# Patient Record
Sex: Female | Born: 1985 | Race: Black or African American | Hispanic: No | Marital: Single | Smoking: Never smoker
Health system: Southern US, Community
[De-identification: ages and names within clinical notes are randomized; demographics above are authoritative.]

## PROBLEM LIST (undated history)

## (undated) DIAGNOSIS — N83209 Unspecified ovarian cyst, unspecified side: Secondary | ICD-10-CM

## (undated) DIAGNOSIS — R002 Palpitations: Secondary | ICD-10-CM

## (undated) HISTORY — DX: Palpitations: R00.2

## (undated) HISTORY — PX: BREAST EXCISIONAL BIOPSY: SUR124

## (undated) HISTORY — DX: Unspecified ovarian cyst, unspecified side: N83.209

---

## 2018-08-16 ENCOUNTER — Encounter (HOSPITAL_COMMUNITY): Payer: Self-pay | Admitting: Emergency Medicine

## 2018-08-16 ENCOUNTER — Emergency Department (HOSPITAL_COMMUNITY)
Admission: EM | Admit: 2018-08-16 | Discharge: 2018-08-16 | Disposition: A | Discharge status: Home / Self Care | Payer: Self-pay | Attending: Emergency Medicine | Admitting: Emergency Medicine

## 2018-08-16 DIAGNOSIS — R1032 Left lower quadrant pain: Secondary | ICD-10-CM | POA: Insufficient documentation

## 2018-08-16 LAB — URINALYSIS, ROUTINE W REFLEX MICROSCOPIC
Bilirubin Urine: NEGATIVE
Glucose, UA: NEGATIVE mg/dL
Hgb urine dipstick: NEGATIVE
Ketones, ur: NEGATIVE mg/dL
Leukocytes, UA: NEGATIVE
Nitrite: NEGATIVE
Protein, ur: NEGATIVE mg/dL
SPECIFIC GRAVITY, URINE: 1.03 (ref 1.005–1.030)
pH: 7 (ref 5.0–8.0)

## 2018-08-16 LAB — COMPREHENSIVE METABOLIC PANEL
ALBUMIN: 3.9 g/dL (ref 3.5–5.0)
ALK PHOS: 71 U/L (ref 38–126)
ALT: 12 U/L (ref 0–44)
AST: 18 U/L (ref 15–41)
Anion gap: 7 (ref 5–15)
BILIRUBIN TOTAL: 0.2 mg/dL — AB (ref 0.3–1.2)
BUN: 9 mg/dL (ref 6–20)
CALCIUM: 9.4 mg/dL (ref 8.9–10.3)
CO2: 24 mmol/L (ref 22–32)
Chloride: 106 mmol/L (ref 98–111)
Creatinine, Ser: 0.82 mg/dL (ref 0.44–1.00)
GFR calc Af Amer: >60 mL/min (ref >60)
GFR calc non Af Amer: >60 mL/min (ref >60)
GLUCOSE: 142 mg/dL — AB (ref 70–99)
Potassium: 3.8 mmol/L (ref 3.5–5.1)
SODIUM: 137 mmol/L (ref 135–145)
TOTAL PROTEIN: 7.3 g/dL (ref 6.5–8.1)

## 2018-08-16 LAB — WET PREP, GENITAL
CLUE CELLS WET PREP: NONE SEEN
Sperm: NONE SEEN
TRICH WET PREP: NONE SEEN
Yeast Wet Prep HPF POC: NONE SEEN

## 2018-08-16 LAB — CBC
HCT: 38 % (ref 36.0–46.0)
HEMOGLOBIN: 12.2 g/dL (ref 12.0–15.0)
MCH: 27.8 pg (ref 26.0–34.0)
MCHC: 32.1 g/dL (ref 30.0–36.0)
MCV: 86.6 fL (ref 80.0–100.0)
PLATELETS: 314 10*3/uL (ref 150–400)
RBC: 4.39 MIL/uL (ref 3.87–5.11)
RDW: 12.6 % (ref 11.5–15.5)
WBC: 8.9 10*3/uL (ref 4.0–10.5)
nRBC: 0 % (ref 0.0–0.2)

## 2018-08-16 LAB — GC/CHLAMYDIA PROBE AMP (~~LOC~~) NOT AT ARMC
CHLAMYDIA, DNA PROBE: NEGATIVE
NEISSERIA GONORRHEA: NEGATIVE

## 2018-08-16 LAB — LIPASE, BLOOD: Lipase: 30 U/L (ref 11–51)

## 2018-08-16 LAB — I-STAT BETA HCG BLOOD, ED (MC, WL, AP ONLY)

## 2018-08-16 LAB — RPR: RPR Ser Ql: NONREACTIVE

## 2018-08-16 LAB — HIV ANTIBODY (ROUTINE TESTING W REFLEX): HIV Screen 4th Generation wRfx: NONREACTIVE

## 2018-08-16 MED ORDER — ACETAMINOPHEN 325 MG PO TABS: 650.0000 mg | ORAL_TABLET | Freq: Four times a day (QID) | ORAL | 0 refills | Status: DC | PRN

## 2018-08-16 NOTE — ED Provider Notes (Signed)
Emergency Department Provider Note   I have reviewed the triage vital signs and the nursing notes.   HISTORY  Chief Complaint Abdominal Pain   HPI Robin Humphrey is a 32 y.o. female without significant past medical history the presents to the emergency department today secondary to pelvic pain.  Patient states that she was recently on the Depakote shot however has been off of it for couple months.  Last month she had a period that felt similar to this but then the symptoms resolved when her.  Went away.  Last week her.  Was back and normal with this pain but then the pain persisted and is progressively worsened since that time.  She states that it is worse in the left side seems to radiate up to her left flank.  Does not seem to be get better with ibuprofen and Tylenol at home.  She has no vaginal discharge or pain with intercourse.  She has no urinary symptoms.  No history of kidney stones.  She had no fever, nausea, vomiting, diarrhea or constipation.  She states when she laughs earlier seem to make the pain worse. No other associated or modifying symptoms.    History reviewed. No pertinent past medical history.  There are no active problems to display for this patient.   Past Surgical History:  Procedure Laterality Date  . CESAREAN SECTION        Allergies Patient has no known allergies.  No family history on file.  Social History Social History   Tobacco Use  . Smoking status: Never Smoker  . Smokeless tobacco: Never Used  Substance Use Topics  . Alcohol use: Not Currently  . Drug use: Not Currently    Review of Systems  All other systems negative except as documented in the HPI. All pertinent positives and negatives as reviewed in the HPI. ____________________________________________   PHYSICAL EXAM:  VITAL SIGNS: ED Triage Vitals  Enc Vitals Group     BP 08/16/18 0010 139/89     Pulse Rate 08/16/18 0010 (!) 102     Resp 08/16/18 0010 18     Temp  08/16/18 0010 98.1 F (36.7 C)     Temp Source 08/16/18 0010 Oral     SpO2 08/16/18 0010 97 %     Weight 08/16/18 0009 210 lb (95.3 kg)     Height 08/16/18 0009 5\' 7"  (1.702 m)    Constitutional: Alert and oriented. Well appearing and in no acute distress. Eyes: Conjunctivae are normal. PERRL. EOMI. Head: Atraumatic. Nose: No congestion/rhinnorhea. Mouth/Throat: Mucous membranes are moist.  Oropharynx non-erythematous. Neck: No stridor.  No meningeal signs.   Cardiovascular: Normal rate, regular rhythm. Good peripheral circulation. Grossly normal heart sounds.   Respiratory: Normal respiratory effort.  No retractions. Lungs CTAB. Gastrointestinal: Soft and mild left pelvic ttp. No distention.  Musculoskeletal: No lower extremity tenderness nor edema. No gross deformities of extremities. Neurologic:  Normal speech and language. No gross focal neurologic deficits are appreciated.  Skin:  Skin is warm, dry and intact. No rash noted.   ____________________________________________   LABS (all labs ordered are listed, but only abnormal results are displayed)  Labs Reviewed  WET PREP, GENITAL - Abnormal; Notable for the following components:      Result Value   WBC, Wet Prep HPF POC FEW (*)    All other components within normal limits  COMPREHENSIVE METABOLIC PANEL - Abnormal; Notable for the following components:   Glucose, Bld 142 (*)  Total Bilirubin 0.2 (*)    All other components within normal limits  URINALYSIS, ROUTINE W REFLEX MICROSCOPIC - Abnormal; Notable for the following components:   APPearance HAZY (*)    All other components within normal limits  LIPASE, BLOOD  CBC  RPR  HIV ANTIBODY (ROUTINE TESTING W REFLEX)  I-STAT BETA HCG BLOOD, ED ([facility], [facility], [facility] ONLY)  GC/CHLAMYDIA PROBE AMP ([place]) NOT AT [facility]   ____________________________________________   INITIAL IMPRESSION / ASSESSMENT AND PLAN / ED COURSE  Likely ovarian cyst. Doesn't want to verify with  [place]. Low suspicion for ovarian torsion or TOA. Will follow up w/ gynecology.    Pertinent labs & imaging results that were available during my care of the patient were reviewed by me and considered in my medical decision making (see chart for details).  ____________________________________________  FINAL CLINICAL IMPRESSION(S) / ED DIAGNOSES  Final diagnoses:  Left lower quadrant abdominal pain    MEDICATIONS GIVEN DURING THIS VISIT:  Medications - No data to display   NEW OUTPATIENT MEDICATIONS STARTED DURING THIS VISIT:  Discharge Medication List as of 08/16/2018  2:49 AM    START taking these medications   Details  acetaminophen (TYLENOL) 325 MG tablet Take 2 tablets (650 mg total) by mouth every 6 (six) hours as needed., Starting Fri 08/16/2018, Print        Note:  This note was prepared with assistance of Dragon voice recognition software. Occasional wrong-word or sound-a-like substitutions may have occurred due to the inherent limitations of voice recognition software.   Marily Memos, MD 08/16/18 [phone]

## 2018-08-16 NOTE — ED Triage Notes (Signed)
Pt reports LLQ abd pain with radiation into L side onset X few days. Pt states she had her first period in months last week after going off Depo but states this pain is worse. Denis current vaginal bleeding or vaginal pain.

## 2018-08-16 NOTE — ED Notes (Signed)
Pelvic cart at bedside. 

## 2018-09-12 ENCOUNTER — Encounter: Payer: Self-pay | Admitting: Family Medicine

## 2018-10-14 ENCOUNTER — Other Ambulatory Visit (HOSPITAL_COMMUNITY)
Admission: RE | Admit: 2018-10-14 | Discharge: 2018-10-14 | Disposition: A | Discharge status: Home / Self Care | Payer: Medicaid Other | Source: Ambulatory Visit | Attending: Obstetrics and Gynecology | Admitting: Obstetrics and Gynecology

## 2018-10-14 ENCOUNTER — Encounter: Payer: Self-pay | Admitting: Obstetrics and Gynecology

## 2018-10-14 ENCOUNTER — Ambulatory Visit (INDEPENDENT_AMBULATORY_CARE_PROVIDER_SITE_OTHER): Payer: Self-pay | Admitting: Obstetrics and Gynecology

## 2018-10-14 VITALS — BP 120/72 | HR 88 | Ht 67.0 in | Wt 214.2 lb

## 2018-10-14 DIAGNOSIS — R109 Unspecified abdominal pain: Secondary | ICD-10-CM | POA: Diagnosis not present

## 2018-10-14 DIAGNOSIS — G8929 Other chronic pain: Secondary | ICD-10-CM

## 2018-10-14 NOTE — Progress Notes (Signed)
C/o intermittent pelvic pain since November-states it worse just before and after period. States last pap 11/2016 at [facility]

## 2018-10-14 NOTE — Progress Notes (Signed)
Obstetrics and Gynecology New Patient Evaluation  Appointment Date: 10/14/2018  OBGYN Clinic: Center for [facility]  Primary Care Provider: Patient, No Pcp Per  Referring Provider: No ref. provider found  Chief Complaint:  Chief Complaint  Patient presents with  . Pelvic Pain    History of Present Illness: Robin Humphrey is a 33 y.o. African-American [phone] (Patient's last menstrual period was 09/26/2018.), seen for the above chief complaint.   Patient was on depo provera for about a year but came off it in September due to gaining weight; she was amenorrheic on it. Her periods came back and she had LLQ abdominal pain around her menses but in November she had pain just like around her periods but the pain persisted and has persisted since then; it comes and goes and no aggravating or alleviating s/s. She has used OCPs in the past  She went to the ED in mid November and had negative lab work; no u/s was done.   Review of Systems: as noted in the History of Present Illness.  Past Medical History:  No past medical history on file.  Past Surgical History:  Past Surgical History:  Procedure Laterality Date  . CESAREAN SECTION      Past Obstetrical History:  OB History  Gravida Para Term Preterm AB Living  4       2 2   SAB TAB Ectopic Multiple Live Births  1 1     2     # Outcome Date GA Lbr Len/2nd Weight Sex Delivery Anes PTL Lv  4 Gravida 2010     CS-Unspec     3 Gravida 2009     CS-Unspec     2 TAB           1 SAB             Past Gynecological History: As per HPI. History of Pap Smear(s): Yes.   Last pap 2018, which was negative per patient at the [facility] She is currently using nothing for contraception.   Social History:  Social History   Socioeconomic History  . Marital status: Single    Spouse name: Not on file  . Number of children: Not on file  . Years of education: Not on file  . Highest education level: Not on file  Occupational History  . Not on  file  Social Needs  . Financial resource strain: Not on file  . Food insecurity:    Worry: Not on file    Inability: Not on file  . Transportation needs:    Medical: Not on file    Non-medical: Not on file  Tobacco Use  . Smoking status: Never Smoker  . Smokeless tobacco: Never Used  Substance and Sexual Activity  . Alcohol use: Not Currently  . Drug use: Not Currently  . Sexual activity: Yes    Birth control/protection: None  Lifestyle  . Physical activity:    Days per week: Not on file    Minutes per session: Not on file  . Stress: Not on file  Relationships  . Social connections:    Talks on phone: Not on file    Gets together: Not on file    Attends religious service: Not on file    Active member of club or organization: Not on file    Attends meetings of clubs or organizations: Not on file    Relationship status: Not on file  . Intimate partner violence:    Fear of current or  ex partner: Not on file    Emotionally abused: Not on file    Physically abused: Not on file    Forced sexual activity: Not on file  Other Topics Concern  . Not on file  Social History Narrative  . Not on file    Family History: No family history on file.   Medications [name] had no medications administered during this visit. Current Outpatient Medications  Medication Sig Dispense Refill  . acetaminophen (TYLENOL) 325 MG tablet Take 2 tablets (650 mg total) by mouth every 6 (six) hours as needed. 30 tablet 0   No current facility-administered medications for this visit.     Allergies Patient has no known allergies.   Physical Exam:  BP 120/72   Pulse 88   Ht 5\' 7"  (1.702 m)   Wt 214 lb 3.2 oz (97.2 kg)   LMP 09/26/2018   BMI 33.55 kg/m  Body mass index is 33.55 kg/m. General appearance: Well nourished, well developed female in no acute distress.  Cardiovascular: normal s1 and s2.  No murmurs, rubs or gallops. Respiratory: Normal respiratory effort palpation, non  distended Abdomen: soft, nttp. Well healed low transverse skin incision Neuro/Psych:  Normal mood and affect.  Skin:  Warm and dry.  Lymphatic:  No inguinal lymphadenopathy.   Pelvic exam: is not limited by body habitus EGBUS: within normal limits, Vagina: within normal limits and with no blood or discharge in the vault, Cervix: normal appearing cervix without tenderness, slightly erythematous (non friable) with 2-3 nabothian cysts. Uterus:  nonenlarged and non tender and Adnexa:  normal adnexa and no mass, fullness, tenderness Rectovaginal: deferred  Laboratory: reviewed  Radiology: none  Assessment: pt stable  Plan:  1. Chronic abdominal pain Patient not actively trying to get pregnant. D/w her that if u/s is negative likely will recommend some type of hormonal therapy.  - Urine Culture - US PELVIC COMPLETE WITH TRANSVAGINAL; Future  Orders Placed This Encounter  Procedures  . Urine Culture  . US PELVIC COMPLETE WITH TRANSVAGINAL    RTC after u/s  Cornelia Copa MD Attending Center for [facility])

## 2018-10-15 LAB — URINE CULTURE

## 2018-10-18 LAB — CYTOLOGY - PAP
Diagnosis: NEGATIVE
HPV (WINDOPATH): NOT DETECTED

## 2018-11-05 ENCOUNTER — Ambulatory Visit (HOSPITAL_COMMUNITY)
Admission: RE | Admit: 2018-11-05 | Discharge: 2018-11-05 | Disposition: A | Discharge status: Home / Self Care | Payer: Medicaid Other | Source: Ambulatory Visit | Attending: Obstetrics and Gynecology | Admitting: Obstetrics and Gynecology

## 2018-11-05 DIAGNOSIS — R109 Unspecified abdominal pain: Secondary | ICD-10-CM | POA: Insufficient documentation

## 2018-11-05 DIAGNOSIS — G8929 Other chronic pain: Secondary | ICD-10-CM

## 2018-11-13 ENCOUNTER — Encounter: Payer: Self-pay | Admitting: Obstetrics and Gynecology

## 2018-11-13 ENCOUNTER — Ambulatory Visit (INDEPENDENT_AMBULATORY_CARE_PROVIDER_SITE_OTHER): Payer: Self-pay | Admitting: Obstetrics and Gynecology

## 2018-11-13 VITALS — BP 112/67 | HR 85 | Wt 214.0 lb

## 2018-11-13 DIAGNOSIS — N83202 Unspecified ovarian cyst, left side: Secondary | ICD-10-CM

## 2018-11-13 DIAGNOSIS — G8929 Other chronic pain: Secondary | ICD-10-CM

## 2018-11-13 DIAGNOSIS — R109 Unspecified abdominal pain: Secondary | ICD-10-CM

## 2018-11-13 MED ORDER — NORGESTIMATE-ETH ESTRADIOL 0.25-35 MG-MCG PO TABS: 1.0000 | ORAL_TABLET | Freq: Every day | ORAL | 3 refills | Status: DC

## 2018-11-13 NOTE — Progress Notes (Signed)
Obstetrics and Gynecology Visit Return Patient Evaluation  Appointment Date: 11/13/2018  Primary Care Provider: Default, Provider  OBGYN Clinic: Center for [facility]  Chief Complaint: follow up ultrasound   History of Present Illness:  Robin Humphrey is a 33 y.o. here with above CC. Patient seen on 1/13 for f/u ED visit for abdominal pain around the time of her period.    Interval History: Since that time, she states that her pain is much improved. LMP 1/20  Review of Systems:  as noted in the History of Present Illness.   Patient Active Problem List   Diagnosis Date Noted  . Left ovarian cyst 11/13/2018  . Chronic abdominal pain 10/14/2018   Medications:  Robin Humphrey had no medications administered during this visit. Current Outpatient Medications  Medication Sig Dispense Refill  . acetaminophen (TYLENOL) 325 MG tablet Take 2 tablets (650 mg total) by mouth every 6 (six) hours as needed. 30 tablet 0   No current facility-administered medications for this visit.     Allergies: has No Known Allergies.  Physical Exam:  BP 112/67   Pulse 85   Wt 214 lb (97.1 kg)   BMI 33.52 kg/m  Body mass index is 33.52 kg/m. General appearance: Well nourished, well developed female in no acute distress.  Abdomen: diffusely non tender to palpation, non distended Neuro/Psych:  Normal mood and affect.    Radiology  CLINICAL DATA:  Chronic pelvic pain  EXAM: TRANSABDOMINAL AND TRANSVAGINAL ULTRASOUND OF PELVIS  TECHNIQUE: Both transabdominal and transvaginal ultrasound examinations of the pelvis were performed. Transabdominal technique was performed for global imaging of the pelvis including uterus, ovaries, adnexal regions, and pelvic cul-de-sac. It was necessary to proceed with endovaginal exam following the transabdominal exam to visualize the endometrium and adnexa.  COMPARISON:  None  FINDINGS: Uterus  Measurements: 9.8 x 5.5 x 6.9 cm = volume: 196 mL.  Normal morphology without mass  Endometrium  Thickness: 15 mm. Heterogeneous appearance hypoechoic area 12 x 14 x 4 mm which could represent a small amount of blood. Remainder of the endometrium appears heterogeneous as well.  Right ovary  Measurements: 3.7 x 3.2 x 3.9 cm = volume: 23.7 mL. Normal morphology without mass. Blood flow present within RIGHT ovary on color Doppler imaging.  Left ovary  Measurements: 3.9 x 2.8 x 3.5 cm = volume: 20.5 mL. Small heterogenous nodule question hemorrhagic cyst measuring 1.9 x 1.4 x 1.5 cm. Blood flow present within LEFT ovary on color Doppler imaging.  Other findings  No free pelvic fluid.  No additional adnexal masses.  IMPRESSION: Heterogeneous appearance of the endometrial complex with a small hypoechoic collection 12 x 14 x 4 mm question endometrial blood.  Small probable hemorrhagic cyst of the LEFT ovary 1.9 cm diameter; short-interval follow up ultrasound in 6-12 weeks is recommended, preferably during the week following the patient's normal menses.   Electronically Signed   By: Ulyses Southward M.D.   On: 11/05/2018 09:51 Assessment: pt doing well  Plan:  1. Left ovarian cyst Recommend 21m follow up visit. Pt to call to look at her availability and call us to set up early cycle u/s  2. Chronic abdominal pain I told her most likely due to a hemorrhagic cyst which may be a rare occurrence in her but hard to say since she is still pretty recently off her depo provera. D/w her re: r/b/a and she would like to do OCPs. Pt doesn't have insurance and was getting depo at the HD.  Paper Rx given for OCPs and recommend after next period and told pt to call HD to see about pricing there, as well.   RTC: PRN  Cornelia Copa MD Attending Center for [facility])

## 2019-04-30 ENCOUNTER — Other Ambulatory Visit: Payer: Self-pay

## 2019-04-30 ENCOUNTER — Emergency Department (HOSPITAL_COMMUNITY)
Admission: EM | Admit: 2019-04-30 | Discharge: 2019-05-01 | Discharge status: Against Medical Advice | Payer: Self-pay | Attending: Emergency Medicine | Admitting: Emergency Medicine

## 2019-04-30 ENCOUNTER — Emergency Department (HOSPITAL_COMMUNITY): Payer: Self-pay

## 2019-04-30 DIAGNOSIS — Z5321 Procedure and treatment not carried out due to patient leaving prior to being seen by health care provider: Secondary | ICD-10-CM | POA: Insufficient documentation

## 2019-04-30 LAB — I-STAT BETA HCG BLOOD, ED (MC, WL, AP ONLY): I-stat hCG, quantitative: <5 m[IU]/mL (ref <5)

## 2019-04-30 LAB — CBC
HCT: 38.1 % (ref 36.0–46.0)
Hemoglobin: 12.4 g/dL (ref 12.0–15.0)
MCH: 27.3 pg (ref 26.0–34.0)
MCHC: 32.5 g/dL (ref 30.0–36.0)
MCV: 83.9 fL (ref 80.0–100.0)
Platelets: 332 10*3/uL (ref 150–400)
RBC: 4.54 MIL/uL (ref 3.87–5.11)
RDW: 13.6 % (ref 11.5–15.5)
WBC: 8.7 10*3/uL (ref 4.0–10.5)
nRBC: 0 % (ref 0.0–0.2)

## 2019-04-30 NOTE — ED Triage Notes (Signed)
Pt c/o shortness of breath and chest pain; told her boyfriend that she thought she was having a panic attack.

## 2019-05-01 LAB — BASIC METABOLIC PANEL
Anion gap: 14 (ref 5–15)
BUN: 8 mg/dL (ref 6–20)
CO2: 19 mmol/L — ABNORMAL LOW (ref 22–32)
Calcium: 9.7 mg/dL (ref 8.9–10.3)
Chloride: 104 mmol/L (ref 98–111)
Creatinine, Ser: 0.75 mg/dL (ref 0.44–1.00)
GFR calc Af Amer: >60 mL/min (ref >60)
GFR calc non Af Amer: >60 mL/min (ref >60)
Glucose, Bld: 159 mg/dL — ABNORMAL HIGH (ref 70–99)
Potassium: 3.2 mmol/L — ABNORMAL LOW (ref 3.5–5.1)
Sodium: 137 mmol/L (ref 135–145)

## 2019-05-01 LAB — TROPONIN I (HIGH SENSITIVITY): Troponin I (High Sensitivity): 4 ng/L (ref <18)

## 2019-05-01 NOTE — ED Notes (Signed)
Called pt. x2 for additional labs with no response

## 2019-05-01 NOTE — ED Notes (Signed)
Pt did not respond to name call for vitals recheck

## 2019-05-01 NOTE — ED Notes (Signed)
Pt still does not respond for vitals recheck

## 2019-08-10 ENCOUNTER — Other Ambulatory Visit: Payer: Self-pay

## 2019-08-10 ENCOUNTER — Ambulatory Visit (INDEPENDENT_AMBULATORY_CARE_PROVIDER_SITE_OTHER): Payer: Self-pay

## 2019-08-10 ENCOUNTER — Ambulatory Visit (HOSPITAL_COMMUNITY)
Admission: EM | Admit: 2019-08-10 | Discharge: 2019-08-10 | Disposition: A | Discharge status: Home / Self Care | Payer: Self-pay | Attending: Family Medicine | Admitting: Family Medicine

## 2019-08-10 DIAGNOSIS — R079 Chest pain, unspecified: Secondary | ICD-10-CM

## 2019-08-10 DIAGNOSIS — F410 Panic disorder [episodic paroxysmal anxiety] without agoraphobia: Secondary | ICD-10-CM

## 2019-08-10 DIAGNOSIS — F411 Generalized anxiety disorder: Secondary | ICD-10-CM

## 2019-08-10 MED ORDER — ALPRAZOLAM 0.5 MG PO TABS: ORAL_TABLET | ORAL | 0 refills | Status: DC

## 2019-08-10 NOTE — ED Triage Notes (Signed)
Pt states having chest pain and pressure in her chest x 2 days. Pt states when she feels the pressure in her chest she feels the pressure in her left arm.

## 2019-08-10 NOTE — Discharge Instructions (Signed)
Need to see a primary care physician I recommend a counselor Take the medicine as needed for panic or severe anxiety This will not be refilled

## 2019-08-10 NOTE — ED Provider Notes (Signed)
[facility]    CSN: [id number] Arrival date & time: 08/10/19  1020      History   Chief Complaint Chief Complaint  Patient presents with  . Chest Pain  . Arm Pain    HPI Robin Humphrey is a 33 y.o. female.   HPI  Robin Humphrey is a single mother.  Lives with her 67-year-old son.  He has autism.  She does get some help from his father and from her mother.  She works nights.  She does not get enough sleep.  She states she had a panic attack about 2 months ago.  She states that she felt like she was going to die.  Felt like she was going to stop breathing.  It finally resolved after about 30 minutes.  She does not feel like she is stressed.  She states she is never been treated for anxiety or depression.  She is tearful frequently throughout the interview. She is here for chest pain.  She gets up pressure in the center of her chest.  She also states she has felt short of breath ever since her last panic attack.  Sometimes when her chest bothers her she will feel some pain in her left arm.  No exertional pain.  No hypertension, diabetes, sedentary lifestyle, cigarette smoking, or cardiac history in the family.  No prior history of cardiac problems.  No lung disease  No past medical history on file.  Patient Active Problem List   Diagnosis Date Noted  . Left ovarian cyst 11/13/2018  . Chronic abdominal pain 10/14/2018    Past Surgical History:  Procedure Laterality Date  . CESAREAN SECTION      OB History    Gravida  4   Para      Term      Preterm      AB  2   Living  2     SAB  1   TAB  1   Ectopic      Multiple      Live Births  2            Home Medications    Prior to Admission medications   Medication Sig Start Date End Date Taking? Authorizing Provider  acetaminophen (TYLENOL) 325 MG tablet Take 2 tablets (650 mg total) by mouth every 6 (six) hours as needed. 08/16/18   Mesner, Corene Cornea, MD  ALPRAZolam Duanne Moron) 0.5 MG tablet Take at first sign of  panic.  Today, take one when you get the prescription and go home to sleep 08/10/19   Raylene Everts, MD  norgestimate-ethinyl estradiol (ORTHO-CYCLEN,SPRINTEC,PREVIFEM) 0.25-35 MG-MCG tablet Take 1 tablet by mouth daily. 11/13/18   Aletha Halim, MD    Family History No family history on file.  Social History Social History   Tobacco Use  . Smoking status: Never Smoker  . Smokeless tobacco: Never Used  Substance Use Topics  . Alcohol use: Not Currently  . Drug use: Not Currently     Allergies   Patient has no known allergies.   Review of Systems Review of Systems  Constitutional: Negative for chills and fever.  HENT: Negative for ear pain and sore throat.   Eyes: Negative for pain and visual disturbance.  Respiratory: Positive for shortness of breath. Negative for cough.   Cardiovascular: Positive for chest pain. Negative for palpitations.  Gastrointestinal: Negative for abdominal pain and vomiting.  Genitourinary: Negative for dysuria and hematuria.  Musculoskeletal: Negative for arthralgias and back pain.  Skin: Negative for color change and rash.  Neurological: Negative for seizures and syncope.  All other systems reviewed and are negative.    Physical Exam Triage Vital Signs ED Triage Vitals  Enc Vitals Group     BP 08/10/19 1046 124/78     Pulse Rate 08/10/19 1046 85     Resp 08/10/19 1046 17     Temp 08/10/19 1046 98.3 F (36.8 C)     Temp Source 08/10/19 1046 Oral     SpO2 08/10/19 1046 100 %     Weight --      Height --      Head Circumference --      Peak Flow --      Pain Score 08/10/19 1045 5     Pain Loc --      Pain Edu? --      Excl. in GC? --    No data found.  Updated Vital Signs BP 124/78 (BP Location: Left Arm)   Pulse 85   Temp 98.3 F (36.8 C) (Oral)   Resp 17   LMP  (Within Weeks) Comment: 2-3 weeks per pt  SpO2 100%   Visual Acuity Right Eye Distance:   Left Eye Distance:   Bilateral Distance:    Right Eye Near:    Left Eye Near:    Bilateral Near:     Physical Exam Constitutional:      General: She is not in acute distress.    Appearance: She is well-developed.  HENT:     Head: Normocephalic and atraumatic.  Eyes:     Conjunctiva/sclera: Conjunctivae normal.     Pupils: Pupils are equal, round, and reactive to light.  Neck:     Musculoskeletal: Normal range of motion.  Cardiovascular:     Rate and Rhythm: Normal rate and regular rhythm.  No extrasystoles are present.    Heart sounds: Normal heart sounds. No murmur.  Pulmonary:     Effort: Pulmonary effort is normal. No respiratory distress.     Breath sounds: Normal breath sounds.  Chest:     Chest wall: No tenderness.  Abdominal:     General: Bowel sounds are normal. There is no distension.     Palpations: Abdomen is soft.     Tenderness: There is no abdominal tenderness.  Musculoskeletal: Normal range of motion.  Skin:    General: Skin is warm and dry.  Neurological:     Mental Status: She is alert.  Psychiatric:        Mood and Affect: Mood is anxious.      UC Treatments / Results  Labs (all labs ordered are listed, but only abnormal results are displayed) Labs Reviewed - No data to display  EKG EKG normal sinus rhythm.  Normal intervals.  No ST or T wave changes  Radiology Dg Chest 2 View  Result Date: 08/10/2019 CLINICAL DATA:  Chest pain. EXAM: CHEST - 2 VIEW COMPARISON:  Chest radiograph 04/30/2019 FINDINGS: The heart size and mediastinal contours are within normal limits. Both lungs are clear. The visualized skeletal structures are unremarkable. IMPRESSION: No active cardiopulmonary disease. Electronically Signed   By: Annia Belt M.D.   On: 08/10/2019 11:51    Procedures Procedures (including critical care time)  Medications Ordered in UC Medications - No data to display  Initial Impression / Assessment and Plan / UC Course  I have reviewed the triage vital signs and the nursing notes.  Pertinent labs &  imaging results that  were available during my care of the patient were reviewed by me and considered in my medical decision making (see chart for details).     We discussed chest pain.  The various causes.  I believe she is having stress related chest pressure.  I believe she needs more help in the home.  She would likely benefit from counseling. We discussed the medicine I am prescribing for her.  I told her this is not a good daily medicine.  It is a good as needed medicine.  It will help with panic attacks and severe anxiety.  It is not a medicine we will keep her on or refill for her. Final Clinical Impressions(s) / UC Diagnoses   Final diagnoses:  Chest pain, unspecified type  Generalized anxiety disorder with panic attacks     Discharge Instructions     Need to see a primary care physician I recommend a counselor Take the medicine as needed for panic or severe anxiety This will not be refilled   ED Prescriptions    Medication Sig Dispense Auth. Provider   ALPRAZolam Prudy Feeler(XANAX) 0.5 MG tablet Take at first sign of panic.  Today, take one when you get the prescription and go home to sleep 12 tablet Eustace MooreNelson, Trigger Frasier Sue, MD     I have reviewed the PDMP during this encounter.   Eustace MooreNelson, [place] Cantero Sue, MD 08/10/19 1919

## 2019-08-18 ENCOUNTER — Emergency Department (HOSPITAL_COMMUNITY): Payer: Medicaid Other

## 2019-08-18 ENCOUNTER — Emergency Department (HOSPITAL_COMMUNITY)
Admission: EM | Admit: 2019-08-18 | Discharge: 2019-08-18 | Disposition: A | Discharge status: Home / Self Care | Payer: Medicaid Other | Attending: Emergency Medicine | Admitting: Emergency Medicine

## 2019-08-18 ENCOUNTER — Other Ambulatory Visit: Payer: Self-pay

## 2019-08-18 ENCOUNTER — Encounter (HOSPITAL_COMMUNITY): Payer: Self-pay

## 2019-08-18 DIAGNOSIS — R0602 Shortness of breath: Secondary | ICD-10-CM | POA: Insufficient documentation

## 2019-08-18 DIAGNOSIS — R079 Chest pain, unspecified: Secondary | ICD-10-CM

## 2019-08-18 LAB — BASIC METABOLIC PANEL
Anion gap: 10 (ref 5–15)
BUN: 8 mg/dL (ref 6–20)
CO2: 21 mmol/L — ABNORMAL LOW (ref 22–32)
Calcium: 9.4 mg/dL (ref 8.9–10.3)
Chloride: 105 mmol/L (ref 98–111)
Creatinine, Ser: 0.75 mg/dL (ref 0.44–1.00)
GFR calc Af Amer: >60 mL/min (ref >60)
GFR calc non Af Amer: >60 mL/min (ref >60)
Glucose, Bld: 101 mg/dL — ABNORMAL HIGH (ref 70–99)
Potassium: 4 mmol/L (ref 3.5–5.1)
Sodium: 136 mmol/L (ref 135–145)

## 2019-08-18 LAB — CBC
HCT: 40.3 % (ref 36.0–46.0)
Hemoglobin: 13.2 g/dL (ref 12.0–15.0)
MCH: 27.8 pg (ref 26.0–34.0)
MCHC: 32.8 g/dL (ref 30.0–36.0)
MCV: 84.8 fL (ref 80.0–100.0)
Platelets: 323 10*3/uL (ref 150–400)
RBC: 4.75 MIL/uL (ref 3.87–5.11)
RDW: 13.1 % (ref 11.5–15.5)
WBC: 7 10*3/uL (ref 4.0–10.5)
nRBC: 0 % (ref 0.0–0.2)

## 2019-08-18 LAB — TROPONIN I (HIGH SENSITIVITY)
Troponin I (High Sensitivity): <2 ng/L (ref <18)
Troponin I (High Sensitivity): 6 ng/L (ref <18)

## 2019-08-18 LAB — D-DIMER, QUANTITATIVE: D-Dimer, Quant: <0.27 ug/mL-FEU (ref 0.00–0.50)

## 2019-08-18 LAB — I-STAT BETA HCG BLOOD, ED (MC, WL, AP ONLY): I-stat hCG, quantitative: <5 m[IU]/mL (ref <5)

## 2019-08-18 MED ORDER — NAPROXEN 500 MG PO TABS: 500.0000 mg | ORAL_TABLET | Freq: Two times a day (BID) | ORAL | 0 refills | Status: DC

## 2019-08-18 NOTE — ED Provider Notes (Signed)
[facility] EMERGENCY DEPARTMENT Provider Note   CSN: [id number] Arrival date & time: 08/18/19  1453     History   Chief Complaint Chief Complaint  Patient presents with  . Chest Pain    HPI Robin Humphrey is a 33 y.o. female with a hx of prior c-section who presents to the ED with complaints of chest discomfort intermittently for the past 1 week.  Patient describes the pain as a substernal pressure which occurs intermittently, daily, variable durations in terms of how long this lasts.  This seems to be a bit worse when she lays in the supine position, no change with exertion or with deep breathing.  She states that with the discomfort she feels her breathing is heavier than usual, at times she has associated anxiety but not consistently.  She was seen in urgent care last week for her symptoms, they were felt to be anxiety related, she was given a prescription for benzos, she has taken them a few times with relief when she did have the associated anxiety.  No other alleviating or aggravating factors.  She denies fever, chills, nausea, vomiting, diaphoresis, syncope, leg pain/swelling, hemoptysis, recent surgery/trauma, recent long travel,  personal hx of cancer, or hx of DVT/PE.  She does take OCPs.  She is unsure about early family history of heart disease.  Denies SI, HI, or hallucinations.        HPI  History reviewed. No pertinent past medical history.  Patient Active Problem List   Diagnosis Date Noted  . Left ovarian cyst 11/13/2018  . Chronic abdominal pain 10/14/2018    Past Surgical History:  Procedure Laterality Date  . CESAREAN SECTION       OB History    Gravida  4   Para      Term      Preterm      AB  2   Living  2     SAB  1   TAB  1   Ectopic      Multiple      Live Births  2            Home Medications    Prior to Admission medications   Medication Sig Start Date End Date Taking? Authorizing Provider   acetaminophen (TYLENOL) 325 MG tablet Take 2 tablets (650 mg total) by mouth every 6 (six) hours as needed. 08/16/18   Mesner, Corene Cornea, MD  ALPRAZolam Duanne Moron) 0.5 MG tablet Take at first sign of panic.  Today, take one when you get the prescription and go home to sleep 08/10/19   Raylene Everts, MD  norgestimate-ethinyl estradiol (ORTHO-CYCLEN,SPRINTEC,PREVIFEM) 0.25-35 MG-MCG tablet Take 1 tablet by mouth daily. 11/13/18   Aletha Halim, MD    Family History History reviewed. No pertinent family history.  Social History Social History   Tobacco Use  . Smoking status: Never Smoker  . Smokeless tobacco: Never Used  Substance Use Topics  . Alcohol use: Yes  . Drug use: Not Currently     Allergies   Patient has no known allergies.   Review of Systems Review of Systems  Constitutional: Negative for chills, diaphoresis and fever.  Respiratory: Positive for shortness of breath. Negative for cough.   Cardiovascular: Positive for chest pain (pressure). Negative for palpitations and leg swelling.  Gastrointestinal: Negative for abdominal pain, nausea and vomiting.  Musculoskeletal: Negative for myalgias.  Neurological: Negative for dizziness, syncope, weakness and numbness.  All other systems reviewed and are  negative.    Physical Exam Updated Vital Signs BP (!) 122/97 (BP Location: Right Arm)   Pulse 88   Temp 98 F (36.7 C) (Oral)   Resp 16   LMP 07/28/2019 (Approximate)   SpO2 99%   Physical Exam Vitals signs and nursing note reviewed.  Constitutional:      General: She is not in acute distress.    Appearance: She is well-developed. She is not toxic-appearing.  HENT:     Head: Normocephalic and atraumatic.  Eyes:     General:        Right eye: No discharge.        Left eye: No discharge.     Conjunctiva/sclera: Conjunctivae normal.  Neck:     Musculoskeletal: Neck supple.  Cardiovascular:     Rate and Rhythm: Normal rate and regular rhythm.     Pulses:           Radial pulses are 2+ on the right side and 2+ on the left side.       Dorsalis pedis pulses are 2+ on the right side and 2+ on the left side.  Pulmonary:     Effort: Pulmonary effort is normal. No respiratory distress.     Breath sounds: Normal breath sounds. No wheezing, rhonchi or rales.  Chest:     Chest wall: No tenderness.  Abdominal:     General: There is no distension.     Palpations: Abdomen is soft.     Tenderness: There is no abdominal tenderness.  Musculoskeletal:     Right lower leg: She exhibits no tenderness. No edema.     Left lower leg: She exhibits no tenderness. No edema.  Skin:    General: Skin is warm and dry.     Findings: No rash.  Neurological:     Mental Status: She is alert.     Comments: Clear speech.   Psychiatric:        Behavior: Behavior normal.    ED Treatments / Results  Labs (all labs ordered are listed, but only abnormal results are displayed) Labs Reviewed  BASIC METABOLIC PANEL - Abnormal; Notable for the following components:      Result Value   CO2 21 (*)    Glucose, Bld 101 (*)    All other components within normal limits  CBC  I-STAT BETA HCG BLOOD, ED ([facility], [facility], AP ONLY)  TROPONIN I (HIGH SENSITIVITY)  TROPONIN I (HIGH SENSITIVITY)    EKG EKG Interpretation  Date/Time:  Monday August 18 2019 15:18:36 EST Ventricular Rate:  94 PR Interval:  152 QRS Duration: 82 QT Interval:  332 QTC Calculation: 415 R Axis:   89 Text Interpretation: Normal sinus rhythm with sinus arrhythmia Normal ECG Confirmed by Geoffery LyonseLo, Douglas ([id number]) on 08/18/2019 7:11:05 PM   Radiology Dg Chest 2 View  Result Date: 08/18/2019 CLINICAL DATA:  Chest pain. EXAM: CHEST - 2 VIEW COMPARISON:  August 10, 2019. FINDINGS: The heart size and mediastinal contours are within normal limits. Both lungs are clear. No pneumothorax or pleural effusion is noted. The visualized skeletal structures are unremarkable. IMPRESSION: No active cardiopulmonary disease.  Electronically Signed   By: Lupita RaiderJames  Green Jr M.D.   On: 08/18/2019 16:18    Procedures Procedures (including critical care time)  Medications Ordered in ED Medications - No data to display   Initial Impression / Assessment and Plan / ED Course  I have reviewed the triage vital signs and the nursing notes.  Pertinent labs &  imaging results that were available during my care of the patient were reviewed by me and considered in my medical decision making (see chart for details).    Patient presents to the emergency department with chest pain. Patient nontoxic appearing, in no apparent distress, vitals without significant abnormality. Fairly benign physical exam. DDX: ACS, pulmonary embolism, dissection, pneumothorax, CHF, infiltrate, arrhythmia, anemia, pericarditis, electrolyte derangement, MSK, GERD, anxiety. Evaluation initiated with labs, EKG, and CXR. Patient on cardiac monitor.   Work-up in the ER reviewed:  CBC: No anemia or leukocytosis. BMP: No significant electrolyte derangement. D-dimer: WNL.  Troponin: No significant elevation. EKG: No STEMI. CXR: Negative, without infiltrate, effusion, pneumothorax, or fracture/dislocation.   Heart pathway score 1, EKG without obvious ischemia, delta troponin without significant elevation, mild change between 1st/2nd troponin but similar to prior troponin earlier this year, doubt ACS, discussed w/ Dr. Judd Lien- OK to discharge home. Patient is low risk wells, D-dimer WNL, doubt pulmonary embolism. Pain is not a tearing sensation, symmetric pulses, no widening of mediastinum on CXR, doubt dissection.  No signs of fluid overload on chest x-ray, doubt CHF.  Cardiac monitor reviewed, no notable arrhythmias or tachycardia.  Considered pericarditis, she has no EKG changes or effusion noted on chest x-ray to suggest this, no recent illnesses, this seems less likely but will provide naproxen to see if this helps. Patient has appeared hemodynamically stable  throughout ER visit and appears safe for discharge with close PCP/cardiology follow up. I discussed results, treatment plan, need for follow-up, and return precautions with the patient. Provided opportunity for questions, patient confirmed understanding and is in agreement with plan.   Findings and plan of care discussed with supervising physician Dr. Judd Lien who is in agreement.   Final Clinical Impressions(s) / ED Diagnoses   Final diagnoses:  Chest pain, unspecified type    ED Discharge Orders         Ordered    naproxen (NAPROSYN) 500 MG tablet  2 times daily     08/18/19 2035           Cherly Anderson, PA-C 08/18/19 2036    Geoffery Lyons, MD 08/18/19 2321

## 2019-08-18 NOTE — Discharge Instructions (Signed)
You were seen in the emergency department today for chest pain. Your work-up in the emergency department has been overall reassuring. Your labs have been fairly normal and or similar to previous blood work you have had done. Your EKG and the enzyme we use to check your heart did not show an acute heart attack at this time. Your chest x-ray was normal.  We would like you to try taking naproxen to see if this helps with your pain.  - Naproxen is a nonsteroidal anti-inflammatory medication that will help with pain and swelling. Be sure to take this medication as prescribed with food, 1 pill every 12 hours,  It should be taken with food, as it can cause stomach upset, and more seriously, stomach bleeding. Do not take other nonsteroidal anti-inflammatory medications with this such as Advil, Motrin, Aleve, Mobic, Goodie Powder, or Motrin.    You make take Tylenol per over the counter dosing with these medications.   We have prescribed you new medication(s) today. Discuss the medications prescribed today with your pharmacist as they can have adverse effects and interactions with your other medicines including over the counter and prescribed medications. Seek medical evaluation if you start to experience new or abnormal symptoms after taking one of these medicines, seek care immediately if you start to experience difficulty breathing, feeling of your throat closing, facial swelling, or rash as these could be indications of a more serious allergic reaction  We would like you to follow up closely with your primary care provider and/or the cardiologist provided in your discharge instructions within 1-3 days. Return to the ER immediately should you experience any new or worsening symptoms including but not limited to return of pain, worsened pain, vomiting, shortness of breath, dizziness, lightheadedness, passing out, or any other concerns that you may have.

## 2019-08-18 NOTE — ED Triage Notes (Signed)
Pt endorses left sided chest pain going down the left arm with orthopnea x 1 week. Went to [facility] last week and diagnosed with anxiety.

## 2019-08-20 ENCOUNTER — Other Ambulatory Visit: Payer: Self-pay

## 2019-08-20 ENCOUNTER — Encounter: Payer: Self-pay | Admitting: Internal Medicine

## 2019-08-20 ENCOUNTER — Ambulatory Visit (INDEPENDENT_AMBULATORY_CARE_PROVIDER_SITE_OTHER): Payer: Medicaid Other | Admitting: Internal Medicine

## 2019-08-20 VITALS — BP 120/80 | HR 72 | Ht 67.0 in | Wt 211.8 lb

## 2019-08-20 DIAGNOSIS — R0602 Shortness of breath: Secondary | ICD-10-CM

## 2019-08-20 DIAGNOSIS — R079 Chest pain, unspecified: Secondary | ICD-10-CM

## 2019-08-20 MED ORDER — NAPROXEN 500 MG PO TABS: 500.0000 mg | ORAL_TABLET | Freq: Two times a day (BID) | ORAL | 0 refills | Status: DC

## 2019-08-20 NOTE — Patient Instructions (Addendum)
Medication Instructions:  Your physician recommends that you continue on your current medications as directed. Please refer to the Current Medication list given to you today.  Take naproxen 500mg  twice daily for 1 additional week  *If you need a refill on your cardiac medications before your next appointment, please call your pharmacy*  Lab Work: NONE If you have labs (blood work) drawn today and your tests are completely normal, you will receive your results only by: Marland Kitchen MyChart Message (if you have MyChart) OR . A paper copy in the mail If you have any lab test that is abnormal or we need to change your treatment, we will call you to review the results.  Testing/Procedures: Echo at [address] - 3rd Floor  Follow-Up: At [facility], you and your health needs are our priority.  As part of our continuing mission to provide you with exceptional heart care, we have created designated Provider Care Teams.  These Care Teams include your primary Cardiologist (physician) and Advanced Practice Providers (APPs -  Physician Assistants and Nurse Practitioners) who all work together to provide you with the care you need, when you need it.  Your next appointment:   After your Echo  The format for your next appointment:   Virtual Visit   Provider:   You may see Dr. Debara Pickett or one of the following Advanced Practice Providers on your designated Care Team:    Almyra Deforest, PA-C  Fabian Sharp, [place] or   Roby Lofts, [place]   Other Instructions

## 2019-08-20 NOTE — Progress Notes (Signed)
OFFICE NOTE  Chief Complaint:  ER follow-up  Primary Care Physician: Default, Provider, MD  HPI:  Robin Humphrey is a 33 y.o. female with no significant past medical history.  Over the past several weeks she has developed some chest discomfort and shortness of breath.  She notes it is worse when laying down at night and has been having some associated tachycardia with exertion.  She was seen by urgent care is felt that is possibly anxiety.  She was given some Xanax which she took when she was symptomatic however it just made her tired.  Subsequently she presented to the ER 2 days ago with more symptoms.  She described it is fairly constant however it does wax and wane some.  It seems to be a little worse when laying down.  Is not necessarily worse with exertion or relieved by rest.  There is no family history of early onset heart disease.  She has no known significant risk factors.  She works at [employer] and is under some stress and has a child with autism at home.  She generally follows good pelvic health practices by wearing a mask.  She denies any recent infective symptoms, fevers or generalized malaise.  EKG performed at the hospital was personally reviewed and showed a sinus tachycardia without ST segment changes.  Chest x-ray showed no active cardiopulmonary disease.  Troponins were negative and her D-dimer was negative.  While in the ER she was started on naproxen with a plan for 1 week course of 500 mg twice daily.  This is reasonable treatment as her symptoms might be musculoskeletal chest pain, costochondritis or possibly pericarditis.  PMHx:  No past medical history on file.  Past Surgical History:  Procedure Laterality Date  . CESAREAN SECTION      FAMHx:  No family history on file.  No family history of early coronary disease  SOCHx:   reports that she has never smoked. She has never used smokeless tobacco. She reports current alcohol use. She reports previous drug use.  ALLERGIES:  No Known Allergies  ROS: Pertinent items noted in HPI and remainder of comprehensive ROS otherwise negative.  HOME MEDS: Current Outpatient Medications on File Prior to Visit  Medication Sig Dispense Refill  . acetaminophen (TYLENOL) 325 MG tablet Take 2 tablets (650 mg total) by mouth every 6 (six) hours as needed. 30 tablet 0  . ALPRAZolam (XANAX) 0.5 MG tablet Take at first sign of panic.  Today, take one when you get the prescription and go home to sleep 12 tablet 0  . naproxen (NAPROSYN) 500 MG tablet Take 1 tablet (500 mg total) by mouth 2 (two) times daily. 14 tablet 0  . norgestimate-ethinyl estradiol (ORTHO-CYCLEN,SPRINTEC,PREVIFEM) 0.25-35 MG-MCG tablet Take 1 tablet by mouth daily. 3 Package 3   No current facility-administered medications on file prior to visit.     LABS/IMAGING: Results for orders placed or performed during the hospital encounter of 08/18/19 (from the past 48 hour(s))  Basic metabolic panel     Status: Abnormal   Collection Time: 08/18/19  3:16 PM  Result Value Ref Range   Sodium 136 135 - 145 mmol/L   Potassium 4.0 3.5 - 5.1 mmol/L   Chloride 105 98 - 111 mmol/L   CO2 21 (L) 22 - 32 mmol/L   Glucose, Bld 101 (H) 70 - 99 mg/dL   BUN 8 6 - 20 mg/dL   Creatinine, Ser 2.53 0.44 - 1.00 mg/dL   Calcium  9.4 8.9 - 10.3 mg/dL   GFR calc non Af Amer >60 >60 mL/min   GFR calc Af Amer >60 >60 mL/min   Anion gap 10 5 - 15    Comment: Performed at [facility] [address], [place] [address]  CBC     Status: None   Collection Time: 08/18/19  3:16 PM  Result Value Ref Range   WBC 7.0 4.0 - 10.5 K/uL   RBC 4.75 3.87 - 5.11 MIL/uL   Hemoglobin 13.2 12.0 - 15.0 g/dL   HCT 40.3 36.0 - 46.0 %   MCV 84.8 80.0 - 100.0 fL   MCH 27.8 26.0 - 34.0 pg   MCHC 32.8 30.0 - 36.0 g/dL   RDW 13.1 11.5 - 15.5 %   Platelets 323 150 - 400 K/uL   nRBC 0.0 0.0 - 0.2 %    Comment: Performed at [facility], [place] [address], [place],  [place] [address]  Troponin I (High Sensitivity)     Status: None   Collection Time: 08/18/19  3:16 PM  Result Value Ref Range   Troponin I (High Sensitivity) <2 <18 ng/L    Comment: Performed at [facility] [address] [place], [place] [address]  D-dimer, quantitative (not at [facility])     Status: None   Collection Time: 08/18/19  3:16 PM  Result Value Ref Range   D-Dimer, Quant <0.27 0.00 - 0.50 ug/mL-FEU    Comment: (NOTE) At the manufacturer cut-off of 0.50 ug/mL FEU, this assay has been documented to exclude PE with a sensitivity and negative predictive value of 97 to 99%.  At this time, this assay has not been approved by the FDA to exclude DVT/VTE. Results should be correlated with clinical presentation. Performed at [facility], [place] [address], [place], [place] [address]   I-Stat beta hCG blood, ED     Status: None   Collection Time: 08/18/19  4:09 PM  Result Value Ref Range   I-stat hCG, quantitative <5.0 <5 mIU/mL   Comment 3            Comment:   GEST. AGE      CONC.  (mIU/mL)   <=1 WEEK        5 - 50     2 WEEKS       50 - 500     3 WEEKS       100 - 10,000     4 WEEKS     1,000 - 30,000        FEMALE AND NON-PREGNANT FEMALE:     LESS THAN 5 mIU/mL   Troponin I (High Sensitivity)     Status: None   Collection Time: 08/18/19  5:16 PM  Result Value Ref Range   Troponin I (High Sensitivity) 6 <18 ng/L    Comment: (NOTE) Elevated high sensitivity troponin I (hsTnI) values and significant  changes across serial measurements may suggest ACS but many other  chronic and acute conditions are known to elevate hsTnI results.  Refer to the "Links" section for chest pain algorithms and additional  guidance. Performed at [facility], [place] [address] [place], [place] [address]    Dg Chest 2 View  Result Date: 08/18/2019 CLINICAL DATA:  Chest pain. EXAM: CHEST - 2 VIEW COMPARISON:  August 10, 2019. FINDINGS: The heart size and mediastinal contours are within  normal limits. Both lungs are clear. No pneumothorax or  pleural effusion is noted. The visualized skeletal structures are unremarkable. IMPRESSION: No active cardiopulmonary disease. Electronically Signed   By: Lupita RaiderJames  Green Jr M.D.   On: 08/18/2019 16:18    LIPID PANEL: No results found for: CHOL, TRIG, HDL, CHOLHDL, VLDL, LDLCALC, LDLDIRECT   WEIGHTS: Wt Readings from Last 3 Encounters:  08/20/19 211 lb 12.8 oz (96.1 kg)  11/13/18 214 lb (97.1 kg)  10/14/18 214 lb 3.2 oz (97.2 kg)    VITALS: BP 120/80   Pulse 72   Ht 5\' 7"  (1.702 m)   Wt 211 lb 12.8 oz (96.1 kg)   LMP 07/28/2019 (Approximate)   SpO2 98%   BMI 33.17 kg/m   EXAM: General appearance: alert and no distress Neck: no carotid bruit, no JVD and thyroid not enlarged, symmetric, no tenderness/mass/nodules Lungs: clear to auscultation bilaterally Heart: regular rate and rhythm, S1, S2 normal, no murmur, click, rub or gallop Abdomen: soft, non-tender; bowel sounds normal; no masses,  no organomegaly Extremities: extremities normal, atraumatic, no cyanosis or edema Pulses: 2+ and symmetric Skin: Skin color, texture, turgor normal. No rashes or lesions Neurologic: Grossly normal Psych: Pleasant  EKG: ER EKG from 08/18/2019 reviewed personally, sinus tachycardia  ASSESSMENT: 1. Atypical chest pain, worse lying down 2. Possible pericarditis or costochondritis 3. Mild dyspnea and tachycardia  PLAN: 1.   Robin Humphrey has what sounds like an atypical chest pain.  I think she is at low risk for coronary disease however it sounds like she could possibly have a pericarditis.  Her EKG did not suggest that however she initially had a little bit of relief in her symptoms after starting naproxen yesterday.  She really is not taking enough to make a significant difference.  I have encouraged her to stay on the 500 mg twice daily for 2 weeks.  We will extend her prescription longer.  We will also get an echocardiogram to evaluate for  pericardial effusion or any structural heart changes.  Her exam was benign.  I did not hear a rub.  Follow-up with me virtually in 2 to 4 weeks to see if symptoms have resolved.  Chrystie NoseKenneth C. Hilty, MD, [facility] Hospital Of Forth WorthFACC, FACP  [place]  [facility]  Medical Director of the [facility] Lipid [facility]  Cardiovascular Risk Reduction [facility] Diplomate of the American Board of Clinical Lipidology Attending Cardiologist  Direct Dial: [phone]  Fax: [phone]  Website:  [email or web address]   Kenneth C Hilty 08/20/2019, 10:18 AM

## 2019-08-21 ENCOUNTER — Telehealth: Payer: Self-pay | Admitting: Internal Medicine

## 2019-08-21 NOTE — Telephone Encounter (Signed)
Per pt message request for refill... Spoke with [facility] ... according to the  08/20/19 OV with Dr. Debara Pickett.. pt to take Naproxen 500mg  bid for 2 more weeks...pt asked for a refill but she picked up #14 on 08/19/19 and there is an RX sent in by Dr. Lysbeth Penner nurse for #14 more sent in 08/20/19 that she still needs to pick up. Pt advised.

## 2019-08-21 NOTE — Telephone Encounter (Signed)
New Message  Pt c/o medication issue:  1. Name of Medication: naproxen (NAPROSYN) 500 MG tablet  2. How are you currently taking this medication (dosage and times per day)?as written  3. Are you having a reaction (difficulty breathing--STAT)?no   4. What is your medication issue? Patient states that new prescription needs to be sent to [facility] ([place]), [place] - [address]

## 2019-08-29 ENCOUNTER — Emergency Department (HOSPITAL_COMMUNITY): Payer: Self-pay

## 2019-08-29 ENCOUNTER — Emergency Department (HOSPITAL_COMMUNITY)
Admission: EM | Admit: 2019-08-29 | Discharge: 2019-08-29 | Disposition: A | Discharge status: Home / Self Care | Payer: Self-pay | Attending: Emergency Medicine | Admitting: Emergency Medicine

## 2019-08-29 ENCOUNTER — Other Ambulatory Visit: Payer: Self-pay

## 2019-08-29 DIAGNOSIS — R0789 Other chest pain: Secondary | ICD-10-CM

## 2019-08-29 DIAGNOSIS — Z79899 Other long term (current) drug therapy: Secondary | ICD-10-CM | POA: Insufficient documentation

## 2019-08-29 LAB — COMPREHENSIVE METABOLIC PANEL
ALT: 12 U/L (ref 0–44)
AST: 14 U/L — ABNORMAL LOW (ref 15–41)
Albumin: 3.9 g/dL (ref 3.5–5.0)
Alkaline Phosphatase: 70 U/L (ref 38–126)
Anion gap: 12 (ref 5–15)
BUN: 12 mg/dL (ref 6–20)
CO2: 21 mmol/L — ABNORMAL LOW (ref 22–32)
Calcium: 9.5 mg/dL (ref 8.9–10.3)
Chloride: 104 mmol/L (ref 98–111)
Creatinine, Ser: 0.71 mg/dL (ref 0.44–1.00)
GFR calc Af Amer: >60 mL/min (ref >60)
GFR calc non Af Amer: >60 mL/min (ref >60)
Glucose, Bld: 120 mg/dL — ABNORMAL HIGH (ref 70–99)
Potassium: 3.5 mmol/L (ref 3.5–5.1)
Sodium: 137 mmol/L (ref 135–145)
Total Bilirubin: 0.8 mg/dL (ref 0.3–1.2)
Total Protein: 7.6 g/dL (ref 6.5–8.1)

## 2019-08-29 LAB — BRAIN NATRIURETIC PEPTIDE: B Natriuretic Peptide: 10.7 pg/mL (ref 0.0–100.0)

## 2019-08-29 LAB — CBC
HCT: 41.1 % (ref 36.0–46.0)
Hemoglobin: 13.7 g/dL (ref 12.0–15.0)
MCH: 27.9 pg (ref 26.0–34.0)
MCHC: 33.3 g/dL (ref 30.0–36.0)
MCV: 83.7 fL (ref 80.0–100.0)
Platelets: 330 10*3/uL (ref 150–400)
RBC: 4.91 MIL/uL (ref 3.87–5.11)
RDW: 13.2 % (ref 11.5–15.5)
WBC: 8.6 10*3/uL (ref 4.0–10.5)
nRBC: 0 % (ref 0.0–0.2)

## 2019-08-29 LAB — I-STAT BETA HCG BLOOD, ED (MC, WL, AP ONLY): I-stat hCG, quantitative: <5 m[IU]/mL (ref <5)

## 2019-08-29 LAB — MAGNESIUM: Magnesium: 1.9 mg/dL (ref 1.7–2.4)

## 2019-08-29 LAB — CBG MONITORING, ED: Glucose-Capillary: 106 mg/dL — ABNORMAL HIGH (ref 70–99)

## 2019-08-29 LAB — LIPASE, BLOOD: Lipase: 26 U/L (ref 11–51)

## 2019-08-29 LAB — TROPONIN I (HIGH SENSITIVITY): Troponin I (High Sensitivity): 3 ng/L (ref <18)

## 2019-08-29 MED ORDER — ASPIRIN 81 MG PO CHEW
324.0000 mg | CHEWABLE_TABLET | Freq: Once | ORAL | Status: AC
  Administered 2019-08-29: 324 mg via ORAL
  Filled 2019-08-29: qty 4

## 2019-08-29 MED ORDER — ALPRAZOLAM 0.5 MG PO TABS: 0.5000 mg | ORAL_TABLET | Freq: Three times a day (TID) | ORAL | 0 refills | Status: AC

## 2019-08-29 NOTE — ED Provider Notes (Signed)
[facility] Provider Note   CSN: [id number] Arrival date & time: 08/29/19  1238     History   Chief Complaint Chief Complaint  Patient presents with  . Chest Pain  . Shortness of Breath    HPI Robin Humphrey is a 33 y.o. female.     HPI Patient presents with her mother who assists with some of the HPI. Patient is generally well, has no chronic medical issues.  There is a family history of heart disease, however. About 1 month ago the patient developed chest pressure, sternal, nonradiating, worse with activity, inspiration.  She has been seen here, is scheduled to see cardiology in 4 days.  But, over the past 3 to 4 days she has had worsening of her pain, as well as dyspnea, which is now interfering with activities of daily life. She has been taking all medication as directed.  She denies fever, cough, chills, weight gain, weight loss, rash, headache, other pain.  She does not smoke, drinks socially. No past medical history on file.  Patient Active Problem List   Diagnosis Date Noted  . Left ovarian cyst 11/13/2018  . Chronic abdominal pain 10/14/2018    Past Surgical History:  Procedure Laterality Date  . CESAREAN SECTION       OB History    Gravida  4   Para      Term      Preterm      AB  2   Living  2     SAB  1   TAB  1   Ectopic      Multiple      Live Births  2            Home Medications    Prior to Admission medications   Medication Sig Start Date End Date Taking? Authorizing Provider  acetaminophen (TYLENOL) 325 MG tablet Take 2 tablets (650 mg total) by mouth every 6 (six) hours as needed. 08/16/18   Mesner, Corene Cornea, MD  ALPRAZolam Duanne Moron) 0.5 MG tablet Take at first sign of panic.  Today, take one when you get the prescription and go home to sleep 08/10/19   Raylene Everts, MD  naproxen (NAPROSYN) 500 MG tablet Take 1 tablet (500 mg total) by mouth 2 (two) times daily. 08/20/19   Hilty, Nadean Corwin, MD  norgestimate-ethinyl estradiol (ORTHO-CYCLEN,SPRINTEC,PREVIFEM) 0.25-35 MG-MCG tablet Take 1 tablet by mouth daily. 11/13/18   Aletha Halim, MD    Family History No family history on file.  Social History Social History   Tobacco Use  . Smoking status: Never Smoker  . Smokeless tobacco: Never Used  Substance Use Topics  . Alcohol use: Yes  . Drug use: Not Currently     Allergies   Patient has no known allergies.   Review of Systems Review of Systems  Constitutional:       Per HPI, otherwise negative  HENT:       Per HPI, otherwise negative  Respiratory:       Per HPI, otherwise negative  Cardiovascular:       Per HPI, otherwise negative  Gastrointestinal: Negative for vomiting.  Endocrine:       Negative aside from HPI  Genitourinary:       Neg aside from HPI   Musculoskeletal:       Per HPI, otherwise negative  Skin: Negative.   Neurological: Negative for syncope.     Physical Exam Updated Vital Signs  BP 113/83   Pulse 82   Temp 97.9 F (36.6 C) (Oral)   Resp 13   Ht 5\' 7"  (1.702 m)   Wt 96.1 kg   SpO2 99%   BMI 33.17 kg/m   Physical Exam Vitals signs and nursing note reviewed.  Constitutional:      General: She is not in acute distress.    Appearance: She is well-developed.  HENT:     Head: Normocephalic and atraumatic.  Eyes:     Conjunctiva/sclera: Conjunctivae normal.  Cardiovascular:     Rate and Rhythm: Normal rate and regular rhythm.  Pulmonary:     Effort: Pulmonary effort is normal. No respiratory distress.     Breath sounds: Normal breath sounds. No stridor.  Abdominal:     General: There is no distension.  Skin:    General: Skin is warm and dry.  Neurological:     Mental Status: She is alert and oriented to person, place, and time.     Cranial Nerves: No cranial nerve deficit.  Psychiatric:        Mood and Affect: Mood is anxious.      ED Treatments / Results  Labs (all labs ordered are listed, but only  abnormal results are displayed) Labs Reviewed  COMPREHENSIVE METABOLIC PANEL - Abnormal; Notable for the following components:      Result Value   CO2 21 (*)    Glucose, Bld 120 (*)    AST 14 (*)    All other components within normal limits  CBG MONITORING, ED - Abnormal; Notable for the following components:   Glucose-Capillary 106 (*)    All other components within normal limits  CBC  MAGNESIUM  LIPASE, BLOOD  BRAIN NATRIURETIC PEPTIDE  I-STAT BETA HCG BLOOD, ED ([facility], [facility], [facility] ONLY)  TROPONIN I (HIGH SENSITIVITY)    EKG EKG Interpretation  Date/Time:  Friday August 29 2019 12:58:14 EST Ventricular Rate:  90 PR Interval:    QRS Duration: 84 QT Interval:  337 QTC Calculation: 413 R Axis:   81 Text Interpretation: Sinus rhythm No significant change since last tracing unremarkable ecg Confirmed by Gerhard MunchLockwood, Hazleigh Mccleave [phone]([id number]) on 08/29/2019 1:06:27 PM   Radiology Dg Chest Portable 1 View  Result Date: 08/29/2019 CLINICAL DATA:  Chest pain and shortness of breath EXAM: PORTABLE CHEST 1 VIEW COMPARISON:  August 18, 2019 FINDINGS: Lungs are clear. Heart is upper normal in size with pulmonary vascularity normal. No adenopathy. No pneumothorax. No bone lesions. IMPRESSION: No edema or consolidation. Heart upper normal in size. No adenopathy evident. Electronically Signed   By: Bretta BangWilliam  Woodruff III M.D.   On: 08/29/2019 13:25    Procedures Procedures (including critical care time)  Medications Ordered in ED Medications  aspirin chewable tablet 324 mg (324 mg Oral Given 08/29/19 1329)     Initial Impression / Assessment and Plan / ED Course  I have reviewed the triage vital signs and the nursing notes.  Pertinent labs & imaging results that were available during my care of the patient were reviewed by me and considered in my medical decision making (see chart for details).        3:25 PM Patient awake, alert, sitting upright, speaking clearly. Vital signs now  unremarkable, no tachycardia, no tachypnea. Discussed all findings at length, including negative D-dimer, normal BNP, normal troponin, nonischemic EKG. Patient has a scheduled follow-up with cardiology in 4 days, was encouraged to keep this appointment. To improve her comfort, patient will continue benzodiazepine pending  that follow-up With consideration of other etiology for her pain, dyspnea, patient will also have referral to pulmonology, as needed next week.  Final Clinical Impressions(s) / ED Diagnoses  Atypical chest pain   Gerhard Munch, MD 08/29/19 1527

## 2019-08-29 NOTE — ED Notes (Signed)
Patient verbalizes understanding of discharge instructions. Opportunity for questioning and answers were provided. Armband removed by staff, pt discharged from ED ambulatory.   

## 2019-08-29 NOTE — ED Triage Notes (Signed)
Pt presents with c/o centralized chest pressure and exertional SOB. Pt endorses intermittent lightheadedness with exertion as well. Pt states she was seen here x1 week ago for same, and referred to a cardiologist. Pt followed up and now has a scheduled ECHO for Tuesday. Pt states she could not wait any longer and wanted to be seen due to the SOB. Pt skin warm and dry, in NAD on arrival.

## 2019-08-29 NOTE — Discharge Instructions (Signed)
As discussed, your evaluation today has been largely reassuring.  But, it is important that you monitor your condition carefully, and do not hesitate to return to the ED if you develop new, or concerning changes in your condition. ? ?Otherwise, please follow-up with your physician for appropriate ongoing care. ? ?

## 2019-09-02 ENCOUNTER — Ambulatory Visit (HOSPITAL_COMMUNITY): Payer: Self-pay | Attending: Cardiovascular Disease

## 2019-09-02 ENCOUNTER — Telehealth: Payer: Self-pay | Admitting: Internal Medicine

## 2019-09-02 ENCOUNTER — Other Ambulatory Visit: Payer: Self-pay

## 2019-09-02 DIAGNOSIS — R079 Chest pain, unspecified: Secondary | ICD-10-CM | POA: Insufficient documentation

## 2019-09-02 DIAGNOSIS — R0602 Shortness of breath: Secondary | ICD-10-CM | POA: Insufficient documentation

## 2019-09-02 NOTE — Progress Notes (Signed)
Cardiology Office Note:    Date:  09/04/2019   ID:  Robin Humphrey, DOB 09/11/1986, MRN [id number]  PCP:  Default, Provider, MD  Cardiologist:  Pixie Casino, MD   Referring MD: No ref. provider found   Chief Complaint  Patient presents with  . Chest Pain    History of Present Illness:    Robin Humphrey is a 33 y.o. female with no prior medical history establish care with Dr. Debara Pickett on 08/20/2019 following an ER visit for some atypical chest pain.  In the ER she ruled out with negative troponins and a nonischemic EKG.  Her D-dimer was also negative.  She was started on naproxen 500 mg twice daily, which helped her chest pain.  Differential includes MSK chest pain, costochondritis, or pericarditis.  Dr. Debara Pickett ordered an echocardiogram which was performed on 09/02/2019 and revealed normal EF, aortic disease, and no pericardial effusion.  Unfortunately she presented back to the ER with complaints of chest pain and shortness of breath on 08/29/2019.  She again ruled out for ACS.  BNP and lipase were normal.  EDP suspected a component of anxiety and continued her benzodiazepine.  He also referred her to pulmonology.  She presents today with continued complaints of chest pain. CP can occur at any time, such as resting supine on her back, when she walks, and when she is active. CP is located on the left and right side of her chest. CP lasts for approximately 5 minutes and occurs several times throughout the day. Chest discomfort use to be pressure, now has progressed to pain. She also reports palpitations and rapid heart rate which makes her feel like she might syncopize. Rapid heart rate wakes her from sleep. The chest pain is always associated with rapid heart rate.    No past medical history on file.  Past Surgical History:  Procedure Laterality Date  . CESAREAN SECTION      Current Medications: Current Meds  Medication Sig  . ALPRAZolam (XANAX) 0.5 MG tablet Take 1 tablet (0.5 mg total) by  mouth 3 (three) times daily for 7 days. Take at first sign of panic.  Today, take one when you get the prescription and go home to sleep  . naproxen (NAPROSYN) 500 MG tablet Take 1 tablet (500 mg total) by mouth 2 (two) times daily.  . norgestimate-ethinyl estradiol (ORTHO-CYCLEN,SPRINTEC,PREVIFEM) 0.25-35 MG-MCG tablet Take 1 tablet by mouth daily.     Allergies:   Patient has no known allergies.   Social History   Socioeconomic History  . Marital status: Single    Spouse name: Not on file  . Number of children: Not on file  . Years of education: Not on file  . Highest education level: Not on file  Occupational History  . Not on file  Social Needs  . Financial resource strain: Not on file  . Food insecurity    Worry: Not on file    Inability: Not on file  . Transportation needs    Medical: Not on file    Non-medical: Not on file  Tobacco Use  . Smoking status: Never Smoker  . Smokeless tobacco: Never Used  Substance and Sexual Activity  . Alcohol use: Yes  . Drug use: Not Currently  . Sexual activity: Yes    Birth control/protection: None  Lifestyle  . Physical activity    Days per week: Not on file    Minutes per session: Not on file  . Stress: Not on file  Relationships  .  Social Herbalist on phone: Not on file    Gets together: Not on file    Attends religious service: Not on file    Active member of club or organization: Not on file    Attends meetings of clubs or organizations: Not on file    Relationship status: Not on file  Other Topics Concern  . Not on file  Social History Narrative  . Not on file     Family History: The patient's family history includes CAD in an other family member.  ROS:   Please see the history of present illness.     All other systems reviewed and are negative.  EKGs/Labs/Other Studies Reviewed:    The following studies were reviewed today:  Echo 09/02/19:  1. Left ventricular ejection fraction, by visual  estimation, is 60 to 65%. The left ventricle has normal function. There is no left ventricular hypertrophy.  2. Global right ventricle has normal systolic function.The right ventricular size is normal. No increase in right ventricular wall thickness.  3. Left atrial size was normal.  4. Right atrial size was normal.  5. The mitral valve is normal in structure. Trace mitral valve regurgitation. No evidence of mitral stenosis.  6. The tricuspid valve is normal in structure. Tricuspid valve regurgitation is not demonstrated.  7. The aortic valve is normal in structure. Aortic valve regurgitation is not visualized. No evidence of aortic valve sclerosis or stenosis.  8. The pulmonic valve was normal in structure. Pulmonic valve regurgitation is not visualized.  9. The atrial septum is grossly normal. 10. The average left ventricular global longitudinal strain is -21.3 %.  EKG:  EKG is  ordered today.  The ekg ordered today demonstrates sinus rhythm HR 83  Recent Labs: 08/29/2019: ALT 12; B Natriuretic Peptide 10.7; BUN 12; Creatinine, Ser 0.71; Hemoglobin 13.7; Magnesium 1.9; Platelets 330; Potassium 3.5; Sodium 137  Recent Lipid Panel No results found for: CHOL, TRIG, HDL, CHOLHDL, VLDL, LDLCALC, LDLDIRECT  Physical Exam:    VS:  BP 122/72   Pulse 83   Ht '5\' 7"'  (1.702 m)   Wt 212 lb (96.2 kg)   BMI 33.20 kg/m     Wt Readings from Last 3 Encounters:  09/04/19 212 lb (96.2 kg)  08/29/19 211 lb 12.8 oz (96.1 kg)  08/20/19 211 lb 12.8 oz (96.1 kg)     GEN: Well nourished, well developed in no acute distress HEENT: Normal NECK: No JVD; No carotid bruits LYMPHATICS: No lymphadenopathy CARDIAC: RRR, no murmurs, rubs, gallops RESPIRATORY:  Clear to auscultation without rales, wheezing or rhonchi  ABDOMEN: Soft, non-tender, non-distended MUSCULOSKELETAL:  No edema; No deformity  SKIN: Warm and dry NEUROLOGIC:  Alert and oriented x 3 PSYCHIATRIC:  Normal affect   ASSESSMENT:    1.  Palpitations   2. Chest pain, unspecified type   3. Shortness of breath   4. Rapid heart rate    PLAN:    In order of problems listed above:  Atypical chest pain Dyspnea Rapid heart rate Palpitations -Upon further questioning.  It sounds as though her chest pain always coincides with palpitations and rapid heart rate -She is adamant that she is not anxious and that this is not a process in relation to high anxiety - I will check a magnesium, BMP, TSH -I will place a 14-day Zio patch -I have also advised her to try magnesium supplements - because the chest pain is positional, I will also check a sed  rate and CRP for pericarditis - I do not suspect an ACS process - she is on estrogen, she does not smoke   Medication Adjustments/Labs and Tests Ordered: Current medicines are reviewed at length with the patient today.  Concerns regarding medicines are outlined above.  Orders Placed This Encounter  Procedures  . C-reactive protein  . Sed Rate (ESR)  . Basic metabolic panel  . Magnesium  . TSH  . LONG TERM MONITOR (3-14 DAYS)  . EKG 12-Lead   No orders of the defined types were placed in this encounter.   Signed, Ledora Bottcher, PA  09/04/2019 3:10 PM    [facility]

## 2019-09-02 NOTE — Telephone Encounter (Signed)
New Message     *STAT* If patient is at the pharmacy, call can be transferred to refill team.   1. Which medications need to be refilled? (please list name of each medication and dose if known) naproxen (NAPROSYN) 500 MG tablet  2. Which pharmacy/location (including street and city if local pharmacy) is medication to be sent to? [place] ([place]), [place] - [address]  3. Do they need a 30 day or 90 day supply? [place]

## 2019-09-02 NOTE — Telephone Encounter (Signed)
°*  STAT* If patient is at the pharmacy, call can be transferred to refill team.   1. Which medications need to be refilled? (please list name of each medication and dose if known) naproxen (NAPROSYN) 500 MG tablet  2. Which pharmacy/location (including street and city if local pharmacy) is medication to be sent to? [place] ([place]), [place] - [address]  3. Do they need a 30 day or 90 day supply? 30 day

## 2019-09-03 ENCOUNTER — Other Ambulatory Visit: Payer: Self-pay

## 2019-09-03 MED ORDER — NAPROXEN 500 MG PO TABS: 500.0000 mg | ORAL_TABLET | Freq: Two times a day (BID) | ORAL | 0 refills | Status: DC

## 2019-09-03 NOTE — Telephone Encounter (Signed)
Rx refilled by MD

## 2019-09-03 NOTE — Telephone Encounter (Signed)
Patient was to take naproxen for 1 additional week per 08/20/2019 visit MyChart message sent asking for update on health status - is she still have CP?

## 2019-09-03 NOTE — Telephone Encounter (Signed)
Refill addressed

## 2019-09-03 NOTE — Telephone Encounter (Signed)
Yes ok to refill 

## 2019-09-03 NOTE — Telephone Encounter (Signed)
Refill request routed to MD.

## 2019-09-03 NOTE — Telephone Encounter (Signed)
MyChart message has been sent to MD to review

## 2019-09-03 NOTE — Telephone Encounter (Signed)
Please advise if OK to refill. Thank You!

## 2019-09-04 ENCOUNTER — Ambulatory Visit (INDEPENDENT_AMBULATORY_CARE_PROVIDER_SITE_OTHER): Payer: Self-pay | Admitting: Physician Assistant

## 2019-09-04 ENCOUNTER — Encounter: Payer: Self-pay | Admitting: Physician Assistant

## 2019-09-04 ENCOUNTER — Telehealth: Payer: Self-pay | Admitting: Radiology

## 2019-09-04 ENCOUNTER — Other Ambulatory Visit: Payer: Self-pay

## 2019-09-04 VITALS — BP 122/72 | HR 83 | Ht 67.0 in | Wt 212.0 lb

## 2019-09-04 DIAGNOSIS — R079 Chest pain, unspecified: Secondary | ICD-10-CM

## 2019-09-04 DIAGNOSIS — R0602 Shortness of breath: Secondary | ICD-10-CM

## 2019-09-04 DIAGNOSIS — R000 Tachycardia, unspecified: Secondary | ICD-10-CM

## 2019-09-04 DIAGNOSIS — R002 Palpitations: Secondary | ICD-10-CM

## 2019-09-04 NOTE — Telephone Encounter (Signed)
Enrolled patient for a 14 day Zio monitor to be mailed to patients home.  

## 2019-09-04 NOTE — Patient Instructions (Signed)
Medication Instructions:  Your physician recommends that you continue on your current medications as directed. Please refer to the Current Medication list given to you today.  *If you need a refill on your cardiac medications before your next appointment, please call your pharmacy*  Lab Work: Your physician recommends that you return for lab work today.  If you have labs (blood work) drawn today and your tests are completely normal, you will receive your results only by: Marland Kitchen MyChart Message (if you have MyChart) OR . A paper copy in the mail If you have any lab test that is abnormal or we need to change your treatment, we will call you to review the results.  Testing/Procedures: Your physician has recommended that you wear a 14 day ZIO patch monitor. Event monitors are medical devices that record the heart's electrical activity. Doctors most often [place] these monitors to diagnose arrhythmias. Arrhythmias are problems with the speed or rhythm of the heartbeat. The monitor is a small, portable device. You can wear one while you do your normal daily activities. This is usually used to diagnose what is causing palpitations/syncope (passing out). This will be mailed to you with instructions and numbers to call if you have questions or problems.  Follow-Up: At [facility], you and your health needs are our priority.  As part of our continuing mission to provide you with exceptional heart care, we have created designated Provider Care Teams.  These Care Teams include your primary Cardiologist (physician) and Advanced Practice Providers (APPs -  Physician Assistants and Nurse Practitioners) who all work together to provide you with the care you need, when you need it.  Your next appointment:   4-5 week(s)  The format for your next appointment:   In Person  Provider:   Fabian Sharp, PA

## 2019-09-05 LAB — BASIC METABOLIC PANEL
BUN/Creatinine Ratio: 16 (ref 9–23)
BUN: 12 mg/dL (ref 6–20)
CO2: 22 mmol/L (ref 20–29)
Calcium: 9.7 mg/dL (ref 8.7–10.2)
Chloride: 103 mmol/L (ref 96–106)
Creatinine, Ser: 0.73 mg/dL (ref 0.57–1.00)
GFR calc Af Amer: 125 mL/min/{1.73_m2} (ref >59)
GFR calc non Af Amer: 109 mL/min/{1.73_m2} (ref >59)
Glucose: 95 mg/dL (ref 65–99)
Potassium: 4.3 mmol/L (ref 3.5–5.2)
Sodium: 139 mmol/L (ref 134–144)

## 2019-09-05 LAB — C-REACTIVE PROTEIN: CRP: 16 mg/L — ABNORMAL HIGH (ref 0–10)

## 2019-09-05 LAB — MAGNESIUM: Magnesium: 1.9 mg/dL (ref 1.6–2.3)

## 2019-09-05 LAB — TSH: TSH: 1.27 u[IU]/mL (ref 0.450–4.500)

## 2019-09-05 LAB — SEDIMENTATION RATE: Sed Rate: 38 mm/hr — ABNORMAL HIGH (ref 0–32)

## 2019-09-08 ENCOUNTER — Encounter: Payer: Self-pay | Admitting: Critical Care Medicine

## 2019-09-08 ENCOUNTER — Other Ambulatory Visit: Payer: Self-pay

## 2019-09-08 ENCOUNTER — Ambulatory Visit (INDEPENDENT_AMBULATORY_CARE_PROVIDER_SITE_OTHER): Payer: Self-pay | Admitting: Critical Care Medicine

## 2019-09-08 VITALS — BP 115/74 | HR 79 | Temp 97.1°F | Ht 67.0 in | Wt 213.4 lb

## 2019-09-08 DIAGNOSIS — R0789 Other chest pain: Secondary | ICD-10-CM

## 2019-09-08 DIAGNOSIS — K219 Gastro-esophageal reflux disease without esophagitis: Secondary | ICD-10-CM

## 2019-09-08 DIAGNOSIS — R0602 Shortness of breath: Secondary | ICD-10-CM

## 2019-09-08 DIAGNOSIS — Z23 Encounter for immunization: Secondary | ICD-10-CM

## 2019-09-08 MED ORDER — OMEPRAZOLE 20 MG PO CPDR: 20.0000 mg | DELAYED_RELEASE_CAPSULE | Freq: Every day | ORAL | 11 refills | Status: DC

## 2019-09-08 NOTE — Progress Notes (Signed)
Synopsis: Referred in December 2020 for atypical CP by No ref. provider found.  Subjective:   PATIENT ID: Robin Humphrey GENDER: female DOB: 1986/01/21, MRN: [id number]  Chief Complaint  Patient presents with   Consult    Patient is here for shortness of breath and chest pain. Patient recently went to hospital 11/27. Patient states when her heart beats fast she has trouble breathing. Patient states she has trouble breathing when laying on her back.    Ms. Michelle is a 33 year old woman referred for evaluation of atypical chest pain and associated shortness of breath.  Her symptoms started suddenly about a month ago and have been intermittent, lasting for about 5 minutes at a time ever since.  She has episodes of chest pressure radiating down her arm that are worse with activity inspiration and associated with dyspnea.  She has had 2 ED visits and been seen by cardiology twice for this.  Cardiology was able to determine that she has also had episodes of palpitations and sensation of increased heart rate often associated with the symptoms, and there going to do long-term rhythm monitoring for 2 weeks.  She has not yet received her Zio patch.  She occasionally has shortness of breath when laying down and resting or when she is walking at home or work.  The pain improves if she lays on her side.  She does not exercise on a regular basis.  She works as a Theatre stage manager.  She denies wheezing, cough, sputum.  Her symptoms improve with rest.  She has 2 siblings with asthma since childhood, but she has no previous history of asthma or other family history of lung disease.  She has not had any recent colds or sinus symptoms.  She has reflux symptoms on a daily basis and sometimes notices shortness of breath associated with this..  She drinks about 1 soda per day and often has to eat late because she works second shift.  She does not notice any foods that particularly trigger her symptoms.  She is not  pregnant and has recently had 2 - pregnancy tests in the ED.  She does not smoke or vape.       Past Medical History:  Diagnosis Date   Ovarian cyst      Family History  Problem Relation Age of Onset   CAD Other    Diabetes Mother    Asthma Sister    Asthma Brother    Heart disease Maternal Aunt    Stroke Maternal Grandmother    Heart disease Maternal Grandmother      Past Surgical History:  Procedure Laterality Date   CESAREAN SECTION      Social History   Socioeconomic History   Marital status: Single    Spouse name: Not on file   Number of children: Not on file   Years of education: Not on file   Highest education level: Not on file  Occupational History   Not on file  Social Needs   Financial resource strain: Not on file   Food insecurity    Worry: Not on file    Inability: Not on file   Transportation needs    Medical: Not on file    Non-medical: Not on file  Tobacco Use   Smoking status: Never Smoker   Smokeless tobacco: Never Used  Substance and Sexual Activity   Alcohol use: Yes    Comment: occasionally   Drug use: Not Currently   Sexual  activity: Yes    Birth control/protection: None  Lifestyle   Physical activity    Days per week: Not on file    Minutes per session: Not on file   Stress: Not on file  Relationships   Social connections    Talks on phone: Not on file    Gets together: Not on file    Attends religious service: Not on file    Active member of club or organization: Not on file    Attends meetings of clubs or organizations: Not on file    Relationship status: Not on file   Intimate partner violence    Fear of current or ex partner: Not on file    Emotionally abused: Not on file    Physically abused: Not on file    Forced sexual activity: Not on file  Other Topics Concern   Not on file  Social History Narrative   Not on file     No Known Allergies   Immunization History  Administered  Date(s) Administered   Influenza,inj,Quad PF,6+ Mos 09/08/2019    Outpatient Medications Prior to Visit  Medication Sig Dispense Refill   naproxen (NAPROSYN) 500 MG tablet Take 1 tablet (500 mg total) by mouth 2 (two) times daily. 14 tablet 0   norgestimate-ethinyl estradiol (ORTHO-CYCLEN,SPRINTEC,PREVIFEM) 0.25-35 MG-MCG tablet Take 1 tablet by mouth daily. 3 Package 3   No facility-administered medications prior to visit.     Review of Systems  Constitutional: Negative for chills, fever and weight loss.  HENT: Negative.   Eyes: Negative.   Respiratory: Positive for shortness of breath. Negative for cough, sputum production and wheezing.   Cardiovascular: Positive for chest pain and palpitations. Negative for leg swelling.  Gastrointestinal: Positive for heartburn. Negative for abdominal pain, blood in stool, constipation, nausea and vomiting.  Genitourinary: Negative.   Musculoskeletal: Negative for joint pain and myalgias.  Skin: Negative for rash.  Neurological: Positive for dizziness. Negative for focal weakness.  Endo/Heme/Allergies: Negative for environmental allergies.     Objective:   Vitals:   09/08/19 1042  BP: 115/74  Pulse: 79  Temp: (!) 97.1 F (36.2 C)  TempSrc: Temporal  SpO2: 98%  Weight: 213 lb 6.4 oz (96.8 kg)  Height: 5\' 7"  (1.702 m)   98% on   RA BMI Readings from Last 3 Encounters:  09/08/19 33.42 kg/m  09/04/19 33.20 kg/m  08/29/19 33.17 kg/m   Wt Readings from Last 3 Encounters:  09/08/19 213 lb 6.4 oz (96.8 kg)  09/04/19 212 lb (96.2 kg)  08/29/19 211 lb 12.8 oz (96.1 kg)    Physical Exam Constitutional:      Appearance: She is not ill-appearing.  HENT:     Head: Normocephalic and atraumatic.     Nose:     Comments: Deferred due to masking requirement.    Mouth/Throat:     Comments: Deferred due to masking requirement. Eyes:     General: No scleral icterus. Neck:     Musculoskeletal: Neck supple.  Cardiovascular:      Rate and Rhythm: Normal rate and regular rhythm.     Heart sounds: No murmur.  Pulmonary:     Comments: Breathing comfortably on room air, no conversational dyspnea.  Clear to auscultation bilaterally. Abdominal:     General: There is no distension.     Palpations: Abdomen is soft.     Tenderness: There is no abdominal tenderness.  Musculoskeletal:        General: No swelling or deformity.  Lymphadenopathy:     Cervical: No cervical adenopathy.  Skin:    General: Skin is warm and dry.     Findings: No rash.  Neurological:     General: No focal deficit present.     Mental Status: She is alert.     Motor: No weakness.  Psychiatric:        Mood and Affect: Mood normal.        Behavior: Behavior normal.      CBC    Component Value Date/Time   WBC 8.6 08/29/2019 1305   RBC 4.91 08/29/2019 1305   HGB 13.7 08/29/2019 1305   HCT 41.1 08/29/2019 1305   PLT 330 08/29/2019 1305   MCV 83.7 08/29/2019 1305   MCH 27.9 08/29/2019 1305   MCHC 33.3 08/29/2019 1305   RDW 13.2 08/29/2019 1305    CHEMISTRY Recent Labs  Lab 09/04/19 1431  NA 139  K 4.3  CL 103  CO2 22  GLUCOSE 95  BUN 12  CREATININE 0.73  CALCIUM 9.7  MG 1.9   Estimated Creatinine Clearance: 119.5 mL/min (by C-G formula based on SCr of 0.73 mg/dL).  09/04/2019: CRP 16 Sed rate 38  08/18/2019: D-dimer <0.27  Chest Imaging- films reviewed: CXR, 1 view 08/29/2019-normal CXR  Pulmonary Functions Testing Results: No flowsheet data found.   Echocardiogram 09/02/2019: LVEF 60 to 65%, normal LA, RV, RA.  Trace MR, otherwise normal valves.  No pericardial effusion.  EKG 08/29/2019- NSR, normal axis.  Normal intervals.  Q waves in inferior and lateral precordial leads.  No ST changes or PR depression.      Assessment & Plan:     ICD-10-CM   1. Atypical chest pain  R07.89 Pulmonary function test  2. Gastroesophageal reflux disease, unspecified whether esophagitis present  K21.9   3. SOB (shortness of  breath)  R06.02   4. Need for immunization against influenza  Z23 Flu Vaccine QUAD 36+ mos IM    Shortness of breath associated with atypical chest pain and reflux symptoms; suspect this is secondary to both of these conditions. Reflux probably explains her rest symptoms that only seem to occur when laying down. -Agree with arrhythmia monitoring per cardiology -Omeprazole once daily-take 30 minutes before breakfast. -PFTs -Flu shot today  GERD -Omeprazole daily -Dietary modifications discussed-she should try and cut out sodas.  RTC in about 2 months after PFTs.    Current Outpatient Medications:    naproxen (NAPROSYN) 500 MG tablet, Take 1 tablet (500 mg total) by mouth 2 (two) times daily., Disp: 14 tablet, Rfl: 0   norgestimate-ethinyl estradiol (ORTHO-CYCLEN,SPRINTEC,PREVIFEM) 0.25-35 MG-MCG tablet, Take 1 tablet by mouth daily., Disp: 3 Package, Rfl: 3   omeprazole (PRILOSEC) 20 MG capsule, Take 1 capsule (20 mg total) by mouth daily., Disp: 30 capsule, Rfl: 11   Steffanie Dunn, DO [facility] 09/08/2019 11:51 AM

## 2019-09-08 NOTE — Patient Instructions (Addendum)
Thank you for visiting Dr. Carlis Abbott at [facility]. We recommend the following: Orders Placed This Encounter  Procedures  . Pulmonary function test   Orders Placed This Encounter  Procedures  . Pulmonary function test    Standing Status:   Future    Standing Expiration Date:   09/07/2020    Order Specific Question:   Where should this test be performed?    Answer:   [facility]    Order Specific Question:   Full PFT: includes the following: basic spirometry, spirometry pre & post bronchodilator, diffusion capacity (DLCO), lung volumes    Answer:   Full PFT    Meds ordered this encounter  Medications  . omeprazole (PRILOSEC) 20 MG capsule    Sig: Take 1 capsule (20 mg total) by mouth daily.    Dispense:  30 capsule    Refill:  11    Return in about 2 months (around 11/09/2019). with Dr. Carlis Abbott.    Please do your part to reduce the spread of COVID-19.

## 2019-09-09 ENCOUNTER — Telehealth: Payer: Medicaid Other | Admitting: Medical

## 2019-09-09 MED ORDER — NAPROXEN 500 MG PO TABS: 500.0000 mg | ORAL_TABLET | Freq: Two times a day (BID) | ORAL | 0 refills | Status: DC

## 2019-09-09 NOTE — Addendum Note (Signed)
Addended by: Devra Dopp E on: 09/09/2019 12:09 PM   Modules accepted: Orders

## 2019-09-11 ENCOUNTER — Other Ambulatory Visit: Payer: Self-pay | Admitting: Physician Assistant

## 2019-09-11 DIAGNOSIS — R079 Chest pain, unspecified: Secondary | ICD-10-CM

## 2019-09-11 MED ORDER — COLCHICINE 0.6 MG PO TABS: 0.6000 mg | ORAL_TABLET | Freq: Every day | ORAL | 2 refills | Status: DC

## 2019-09-11 MED ORDER — PANTOPRAZOLE SODIUM 40 MG PO TBEC: 40.0000 mg | DELAYED_RELEASE_TABLET | Freq: Every day | ORAL | 1 refills | Status: DC

## 2019-09-12 ENCOUNTER — Other Ambulatory Visit (INDEPENDENT_AMBULATORY_CARE_PROVIDER_SITE_OTHER): Payer: Self-pay

## 2019-09-12 DIAGNOSIS — R002 Palpitations: Secondary | ICD-10-CM

## 2019-09-16 ENCOUNTER — Other Ambulatory Visit: Payer: Self-pay

## 2019-09-17 ENCOUNTER — Telehealth: Payer: Self-pay | Admitting: Physician Assistant

## 2019-09-17 NOTE — Telephone Encounter (Signed)
I reached out to the patient to make sure she was only taking naproxen and not both naproxen and ibuprofen.  She confirmed that she was only taking naproxen daily.  She did start colchicine therapy last week.  She states that she is feeling much better from a chest pain standpoint.  We will continue colchicine for 3 months.  She received her heart monitor in the mail on Friday and started wearing it that day.  We will follow-up on her heart monitor results.

## 2019-10-14 NOTE — Progress Notes (Signed)
Cardiology Office Note:    Date:  10/16/2019   ID:  Robin Humphrey, DOB 22-Jul-1986, MRN [id number]  PCP:  Default, Provider, MD  Cardiologist:  Pixie Casino, MD   Referring MD: No ref. provider found   Chief Complaint  Patient presents with  . Follow-up  atypical chest pain  History of Present Illness:    Robin Humphrey is a 34 y.o. female with no prior medical history establish care with Dr. Debara Pickett on 08/20/2019 following an ER visit for some atypical chest pain.  In the ER she ruled out with negative troponins and a nonischemic EKG.  Her D-dimer was also negative.  She was started on naproxen 500 mg twice daily, which helped her chest pain.  Differential includes MSK chest pain, costochondritis, or pericarditis.  Dr. Debara Pickett ordered an echocardiogram which was performed on 09/02/2019 and revealed normal EF, no valvular disease, and no pericardial effusion.  Unfortunately she presented back to the ER with complaints of chest pain on 08/29/2019.  She again ruled out for ACS.  BNP and lipase were normal.  EDP suspected a component of anxiety and continued her benzodiazepine.  He also referred her to pulmonology.  I saw her in clinic on 09/04/19. Of note, she described palpitations which coincided with the chest pain and rapid heart rate that was waking her from sleep. In response, I placed a 14-day zio patch which showed sinus rhythm with a single 6-beat run of SVT, but otherwise no ectopy. TSH was normal.  CRP and sed rate were mildly elevated. Case discussed with Dr. Debara Pickett. I ordered colchicine and motrin for suspected pericarditis and she reported relief of her chest pain via phone call on 09/17/19.   She returns for follow up today. She reports that she never had chest pain or palpations while wearing the monitor. Two days after she took off the monitor, she started having chest pain again. She is lactose intolerant and gets the chest pain when she eats cheese due to gas. The chest pain is sometimes  related to food. Chest pain can be exertional and relieved with rest. Also relieved with ibuprofen/aleve. There is sometimes a pleuritic component with increased pain with deep inspiration. She also experiences palpitations when she wakes up from sleep due to changes in her breathing. Those palpitations can be followed by chest pain.    Past Medical History:  Diagnosis Date  . Ovarian cyst     Past Surgical History:  Procedure Laterality Date  . CESAREAN SECTION      Current Medications: Current Meds  Medication Sig  . colchicine 0.6 MG tablet Take 1 tablet (0.6 mg total) by mouth daily.  . naproxen (NAPROSYN) 500 MG tablet Take 1 tablet (500 mg total) by mouth 2 (two) times daily.  . norgestimate-ethinyl estradiol (ORTHO-CYCLEN,SPRINTEC,PREVIFEM) 0.25-35 MG-MCG tablet Take 1 tablet by mouth daily.  Marland Kitchen omeprazole (PRILOSEC) 20 MG capsule Take 1 capsule (20 mg total) by mouth daily.  . pantoprazole (PROTONIX) 40 MG tablet Take 1 tablet (40 mg total) by mouth daily. Take while taking ibuprofen.     Allergies:   Patient has no known allergies.   Social History   Socioeconomic History  . Marital status: Single    Spouse name: Not on file  . Number of children: Not on file  . Years of education: Not on file  . Highest education level: Not on file  Occupational History  . Not on file  Tobacco Use  . Smoking status: Never Smoker  .  Smokeless tobacco: Never Used  Substance and Sexual Activity  . Alcohol use: Yes    Comment: occasionally  . Drug use: Not Currently  . Sexual activity: Yes    Birth control/protection: None  Other Topics Concern  . Not on file  Social History Narrative  . Not on file   Social Determinants of Health   Financial Resource Strain:   . Difficulty of Paying Living Expenses: Not on file  Food Insecurity:   . Worried About Charity fundraiser in the Last Year: Not on file  . Ran Out of Food in the Last Year: Not on file  Transportation Needs:   .  Lack of Transportation (Medical): Not on file  . Lack of Transportation (Non-Medical): Not on file  Physical Activity:   . Days of Exercise per Week: Not on file  . Minutes of Exercise per Session: Not on file  Stress:   . Feeling of Stress : Not on file  Social Connections:   . Frequency of Communication with Friends and Family: Not on file  . Frequency of Social Gatherings with Friends and Family: Not on file  . Attends Religious Services: Not on file  . Active Member of Clubs or Organizations: Not on file  . Attends Archivist Meetings: Not on file  . Marital Status: Not on file     Family History: The patient's family history includes Asthma in her brother and sister; CAD in an other family member; Diabetes in her mother; Heart disease in her maternal aunt and maternal grandmother; Stroke in her maternal grandmother.  ROS:   Please see the history of present illness.     All other systems reviewed and are negative.  EKGs/Labs/Other Studies Reviewed:    The following studies were reviewed today:  14 day zio patch 10/08/19: Sinus rhythm with one short six beat run of SVT.  Otherwise no significant ectopy.   Echo 09/02/19:  1. Left ventricular ejection fraction, by visual estimation, is 60 to 65%. The left ventricle has normal function. There is no left ventricular hypertrophy.  2. Global right ventricle has normal systolic function.The right ventricular size is normal. No increase in right ventricular wall thickness.  3. Left atrial size was normal.  4. Right atrial size was normal.  5. The mitral valve is normal in structure. Trace mitral valve regurgitation. No evidence of mitral stenosis.  6. The tricuspid valve is normal in structure. Tricuspid valve regurgitation is not demonstrated.  7. The aortic valve is normal in structure. Aortic valve regurgitation is not visualized. No evidence of aortic valve sclerosis or stenosis.  8. The pulmonic valve was normal in  structure. Pulmonic valve regurgitation is not visualized.  9. The atrial septum is grossly normal. 10. The average left ventricular global longitudinal strain is -21.3 %.  EKG:  EKG is not ordered today.    Recent Labs: 08/29/2019: ALT 12; B Natriuretic Peptide 10.7; Hemoglobin 13.7; Platelets 330 09/04/2019: BUN 12; Creatinine, Ser 0.73; Magnesium 1.9; Potassium 4.3; Sodium 139; TSH 1.270  Recent Lipid Panel No results found for: CHOL, TRIG, HDL, CHOLHDL, VLDL, LDLCALC, LDLDIRECT  Physical Exam:    VS:  BP 139/84   Pulse (!) 105   Temp 97.9 F (36.6 C)   Ht '5\' 7"'  (1.702 m)   Wt 216 lb (98 kg)   SpO2 99%   BMI 33.83 kg/m     Wt Readings from Last 3 Encounters:  10/16/19 216 lb (98 kg)  09/08/19  213 lb 6.4 oz (96.8 kg)  09/04/19 212 lb (96.2 kg)     GEN:  Well nourished, well developed in no acute distress HEENT: Normal NECK: No JVD; No carotid bruits LYMPHATICS: No lymphadenopathy CARDIAC: RRR, no murmurs, rubs, gallops RESPIRATORY:  Clear to auscultation without rales, wheezing or rhonchi  ABDOMEN: Soft, non-tender, non-distended MUSCULOSKELETAL:  No edema; No deformity  SKIN: Warm and dry NEUROLOGIC:  Alert and oriented x 3 PSYCHIATRIC:  Normal affect   ASSESSMENT:    1. Chest pain of uncertain etiology   2. Chest pain, unspecified type   3. Palpitations   4. Rapid heart rate    PLAN:    In order of problems listed above:  Atypical chest pain - echocardiogram with normal structure and function - mildly elevated sed rate and CRP raised the question of pericarditis - I started colchicine for 3 months with 2 weeks of aleve which she states has helped her chest pain - her chest pain sounds atypical, improving but still ongoing - I continue to have a low suspicion for ACS - I suspect pericarditis needs more time - will redraw inflammatory markers today to make sure they are stable or improving - I recommend continuing with colchicine and PRN aleve - she is  taking 400 mg magnesium and protonix - overall, I think she is improving, but is frustrated that her pain in not completely gone - I advised that we re-evaluate in another month   Palpitations  Rapid heart rate - zio patch with an isolated 6-beat run of NSVT - unfortunately, she had no events while wearing the device, but had chest pain and palpitations 2 days after taking off the zio patch - will continue colchicine, aleve PRN, Magnesium, and protonix - I started 25 mg toprol at night - she stopped wearing her apple watch bc it kept alarming for increased heart rate into the 120s - if still having problems at next follow up, may need a 30 day monitor  Follow up with Dr. Debara Pickett. I will appreciate his input.  Medication Adjustments/Labs and Tests Ordered: Current medicines are reviewed at length with the patient today.  Concerns regarding medicines are outlined above.  Orders Placed This Encounter  Procedures  . C-reactive protein  . Sed Rate (ESR)   Meds ordered this encounter  Medications  . metoprolol succinate (TOPROL XL) 25 MG 24 hr tablet    Sig: Take 1 tablet (25 mg total) by mouth at bedtime.    Dispense:  30 tablet    Refill:  3    Signed, Ledora Bottcher, [place]  10/16/2019 2:13 PM    [facility]

## 2019-10-16 ENCOUNTER — Other Ambulatory Visit: Payer: Self-pay

## 2019-10-16 ENCOUNTER — Ambulatory Visit (INDEPENDENT_AMBULATORY_CARE_PROVIDER_SITE_OTHER): Payer: Self-pay | Admitting: Physician Assistant

## 2019-10-16 ENCOUNTER — Encounter: Payer: Self-pay | Admitting: Physician Assistant

## 2019-10-16 VITALS — BP 139/84 | HR 105 | Temp 97.9°F | Ht 67.0 in | Wt 216.0 lb

## 2019-10-16 DIAGNOSIS — R079 Chest pain, unspecified: Secondary | ICD-10-CM

## 2019-10-16 DIAGNOSIS — R000 Tachycardia, unspecified: Secondary | ICD-10-CM

## 2019-10-16 DIAGNOSIS — R002 Palpitations: Secondary | ICD-10-CM

## 2019-10-16 MED ORDER — METOPROLOL SUCCINATE ER 25 MG PO TB24: 25.0000 mg | ORAL_TABLET | Freq: Every day | ORAL | 3 refills | Status: DC

## 2019-10-16 NOTE — Patient Instructions (Signed)
Medication Instructions:   START Metoprolol Succinate (TOPROL-XL) 25 mg daily at bedtime.  *If you need a refill on your cardiac medications before your next appointment, please call your pharmacy*  Lab Work: Your physician recommends that you return for lab work today: CRP, Sed Rate  If you have labs (blood work) drawn today and your tests are completely normal, you will receive your results only by: Marland Kitchen MyChart Message (if you have MyChart) OR . A paper copy in the mail If you have any lab test that is abnormal or we need to change your treatment, we will call you to review the results.  Follow-Up: At [facility] you and your health needs are our priority.  As part of our continuing mission to provide you with exceptional heart care, we have created designated Provider Care Teams.  These Care Teams include your primary Cardiologist (physician) and Advanced Practice Providers (APPs -  Physician Assistants and Nurse Practitioners) who all work together to provide you with the care you need, when you need it.  Your next appointment:   Tuesday, 11/12/19 at 8:00 AM.  The format for your next appointment:   In Person  Provider:   K. Italy Hilty, MD

## 2019-10-17 LAB — SEDIMENTATION RATE: Sed Rate: 9 mm/hr (ref 0–32)

## 2019-10-17 LAB — C-REACTIVE PROTEIN: CRP: 8 mg/L (ref 0–10)

## 2019-11-03 ENCOUNTER — Other Ambulatory Visit (HOSPITAL_COMMUNITY)
Admission: RE | Admit: 2019-11-03 | Discharge: 2019-11-03 | Disposition: A | Discharge status: Home / Self Care | Payer: Medicaid Other | Source: Ambulatory Visit | Attending: Critical Care Medicine | Admitting: Critical Care Medicine

## 2019-11-03 DIAGNOSIS — Z20822 Contact with and (suspected) exposure to covid-19: Secondary | ICD-10-CM | POA: Diagnosis not present

## 2019-11-03 DIAGNOSIS — Z01812 Encounter for preprocedural laboratory examination: Secondary | ICD-10-CM | POA: Insufficient documentation

## 2019-11-03 LAB — SARS CORONAVIRUS 2 (TAT 6-24 HRS): SARS Coronavirus 2: NEGATIVE

## 2019-11-06 ENCOUNTER — Ambulatory Visit (INDEPENDENT_AMBULATORY_CARE_PROVIDER_SITE_OTHER): Payer: Medicaid Other | Admitting: Critical Care Medicine

## 2019-11-06 ENCOUNTER — Encounter: Payer: Self-pay | Admitting: Critical Care Medicine

## 2019-11-06 ENCOUNTER — Other Ambulatory Visit: Payer: Self-pay

## 2019-11-06 VITALS — BP 112/68 | HR 81 | Temp 97.6°F | Ht 67.0 in | Wt 212.0 lb

## 2019-11-06 DIAGNOSIS — R0789 Other chest pain: Secondary | ICD-10-CM

## 2019-11-06 LAB — PULMONARY FUNCTION TEST
DL/VA % pred: 124 %
DL/VA: 5.52 ml/min/mmHg/L
DLCO unc % pred: 103 %
DLCO unc: 25.38 ml/min/mmHg
FEF 25-75 Post: 4.41 L/sec
FEF 25-75 Pre: 3.34 L/sec
FEF2575-%Change-Post: 31 %
FEF2575-%Pred-Post: 132 %
FEF2575-%Pred-Pre: 100 %
FEV1-%Change-Post: 9 %
FEV1-%Pred-Post: 109 %
FEV1-%Pred-Pre: 100 %
FEV1-Post: 3.24 L
FEV1-Pre: 2.97 L
FEV1FVC-%Change-Post: 3 %
FEV1FVC-%Pred-Pre: 99 %
FEV6-%Change-Post: 5 %
FEV6-%Pred-Post: 106 %
FEV6-%Pred-Pre: 101 %
FEV6-Post: 3.71 L
FEV6-Pre: 3.53 L
FEV6FVC-%Pred-Post: 101 %
FEV6FVC-%Pred-Pre: 101 %
FVC-%Change-Post: 5 %
FVC-%Pred-Post: 105 %
FVC-%Pred-Pre: 100 %
FVC-Post: 3.71 L
FVC-Pre: 3.53 L
Post FEV1/FVC ratio: 87 %
Post FEV6/FVC ratio: 100 %
Pre FEV1/FVC ratio: 84 %
Pre FEV6/FVC Ratio: 100 %
RV % pred: 91 %
RV: 1.46 L
TLC % pred: 92 %
TLC: 5.07 L

## 2019-11-06 LAB — BASIC METABOLIC PANEL
BUN: 12 mg/dL (ref 6–23)
CO2: 27 mEq/L (ref 19–32)
Calcium: 10 mg/dL (ref 8.4–10.5)
Chloride: 103 mEq/L (ref 96–112)
Creatinine, Ser: 0.75 mg/dL (ref 0.40–1.20)
GFR: 107.46 mL/min (ref >60.00)
Glucose, Bld: 114 mg/dL — ABNORMAL HIGH (ref 70–99)
Potassium: 3.9 mEq/L (ref 3.5–5.1)
Sodium: 137 mEq/L (ref 135–145)

## 2019-11-06 NOTE — Addendum Note (Signed)
Addended by: Demetrio Lapping E on: 11/06/2019 02:40 PM   Modules accepted: Orders

## 2019-11-06 NOTE — Addendum Note (Signed)
Addended by: Pilar Grammes on: 11/06/2019 02:40 PM   Modules accepted: Orders

## 2019-11-06 NOTE — Patient Instructions (Addendum)
Thank you for visiting Dr. Chestine Spore at [facility]. We recommend the following: Orders Placed This Encounter  Procedures  . CT Angio Chest W/Cm &/Or Wo Cm   Orders Placed This Encounter  Procedures  . CT Angio Chest W/Cm &/Or Wo Cm    Standing Status:   Future    Standing Expiration Date:   02/02/2021    Order Specific Question:   ** REASON FOR EXAM (FREE TEXT)    Answer:   r/o PE    Order Specific Question:   If indicated for the ordered procedure, I authorize the administration of contrast media per Radiology protocol    Answer:   Yes    Order Specific Question:   Is patient pregnant?    Answer:   No    Order Specific Question:   Preferred imaging location?    Answer:   [facility]    Order Specific Question:   Radiology Contrast Protocol - do NOT remove file path    Answer:   [email or web address]      Return if symptoms worsen or fail to improve.    Please do your part to reduce the spread of COVID-19.

## 2019-11-06 NOTE — Progress Notes (Signed)
PFT done today. 

## 2019-11-06 NOTE — Progress Notes (Signed)
Synopsis: Referred in December 2020 for atypical CP by No ref. provider found.  Subjective:   PATIENT ID: [name] GENDER: female DOB: 25-Jan-1986, MRN: [id number]  Chief Complaint  Patient presents with  . Follow-up    Patient reports that she still gets sob with exertion. She also reports that she has had trouble taking deep breath. She had a PFT today.     Mrs. Federer is a 34 year old woman who presents for follow-up of atypical chest pain with associated shortness of breath.  She has been treated for pericarditis with colchicine and NSAIDs with improvement of her inflammatory markers.  She recently followed up with cardiology, NP Micah Flesher on 10/16/2019-note reviewed.  She recently had a 14-day cardiac event monitor in place, when she did not have any episodes of chest pain or shortness of breath.  She had 1 SVT episode during this, but no other arrhythmias.  She has since been started on metoprolol.  2 days following removal of the monitoring device she had recurrent CP.  Her chest pain or shortness of breath are worse when laying flat, and sometimes with walking.  It occurs every day still.  She has no personal history of DVT, but has grandmothers who have had DVTs.     OV 09/08/2019: Ms. Weiler is a 34 year old woman referred for evaluation of atypical chest pain and associated shortness of breath.  Her symptoms started suddenly about a month ago and have been intermittent, lasting for about 5 minutes at a time ever since.  She has episodes of chest pressure radiating down her arm that are worse with activity inspiration and associated with dyspnea.  She has had 2 ED visits and been seen by cardiology twice for this.  Cardiology was able to determine that she has also had episodes of palpitations and sensation of increased heart rate often associated with the symptoms, and there going to do long-term rhythm monitoring for 2 weeks.  She has not yet received her Zio patch.  She occasionally  has shortness of breath when laying down and resting or when she is walking at home or work.  The pain improves if she lays on her side.  She does not exercise on a regular basis.  She works as a Psychiatrist.  She denies wheezing, cough, sputum.  Her symptoms improve with rest.  She has 2 siblings with asthma since childhood, but she has no previous history of asthma or other family history of lung disease.  She has not had any recent colds or sinus symptoms.  She has reflux symptoms on a daily basis and sometimes notices shortness of breath associated with this..  She drinks about 1 soda per day and often has to eat late because she works second shift.  She does not notice any foods that particularly trigger her symptoms.  She is not pregnant and has recently had 2 - pregnancy tests in the ED.  She does not smoke or vape.    Past Medical History:  Diagnosis Date  . Ovarian cyst      Family History  Problem Relation Age of Onset  . CAD Other   . Diabetes Mother   . Asthma Sister   . Asthma Brother   . Heart disease Maternal Aunt   . Stroke Maternal Grandmother   . Heart disease Maternal Grandmother      Past Surgical History:  Procedure Laterality Date  . CESAREAN SECTION      Social  History   Socioeconomic History  . Marital status: Single    Spouse name: Not on file  . Number of children: Not on file  . Years of education: Not on file  . Highest education level: Not on file  Occupational History  . Not on file  Tobacco Use  . Smoking status: Never Smoker  . Smokeless tobacco: Never Used  Substance and Sexual Activity  . Alcohol use: Yes    Comment: occasionally  . Drug use: Not Currently  . Sexual activity: Yes    Birth control/protection: None  Other Topics Concern  . Not on file  Social History Narrative  . Not on file   Social Determinants of Health   Financial Resource Strain:   . Difficulty of Paying Living Expenses: Not on file  Food  Insecurity:   . Worried About Programme researcher, broadcasting/film/video in the Last Year: Not on file  . Ran Out of Food in the Last Year: Not on file  Transportation Needs:   . Lack of Transportation (Medical): Not on file  . Lack of Transportation (Non-Medical): Not on file  Physical Activity:   . Days of Exercise per Week: Not on file  . Minutes of Exercise per Session: Not on file  Stress:   . Feeling of Stress : Not on file  Social Connections:   . Frequency of Communication with Friends and Family: Not on file  . Frequency of Social Gatherings with Friends and Family: Not on file  . Attends Religious Services: Not on file  . Active Member of Clubs or Organizations: Not on file  . Attends Banker Meetings: Not on file  . Marital Status: Not on file  Intimate Partner Violence:   . Fear of Current or Ex-Partner: Not on file  . Emotionally Abused: Not on file  . Physically Abused: Not on file  . Sexually Abused: Not on file     No Known Allergies   Immunization History  Administered Date(s) Administered  . Influenza,inj,Quad PF,6+ Mos 09/08/2019    Outpatient Medications Prior to Visit  Medication Sig Dispense Refill  . colchicine 0.6 MG tablet Take 1 tablet (0.6 mg total) by mouth daily. 30 tablet 2  . metoprolol succinate (TOPROL XL) 25 MG 24 hr tablet Take 1 tablet (25 mg total) by mouth at bedtime. 30 tablet 3  . naproxen (NAPROSYN) 500 MG tablet Take 1 tablet (500 mg total) by mouth 2 (two) times daily. 14 tablet 0  . norgestimate-ethinyl estradiol (ORTHO-CYCLEN,SPRINTEC,PREVIFEM) 0.25-35 MG-MCG tablet Take 1 tablet by mouth daily. 3 Package 3  . omeprazole (PRILOSEC) 20 MG capsule Take 1 capsule (20 mg total) by mouth daily. 30 capsule 11  . pantoprazole (PROTONIX) 40 MG tablet Take 1 tablet (40 mg total) by mouth daily. Take while taking ibuprofen. 30 tablet 1   No facility-administered medications prior to visit.    Review of Systems  Constitutional: Negative for chills,  fever and weight loss.  HENT: Negative.   Eyes: Negative.   Respiratory: Positive for shortness of breath. Negative for cough, sputum production and wheezing.   Cardiovascular: Positive for chest pain and palpitations. Negative for leg swelling.  Gastrointestinal: Positive for heartburn. Negative for abdominal pain, blood in stool, constipation, nausea and vomiting.  Genitourinary: Negative.   Musculoskeletal: Negative for joint pain and myalgias.  Skin: Negative for rash.  Neurological: Positive for dizziness. Negative for focal weakness.  Endo/Heme/Allergies: Negative for environmental allergies.     Objective:  Vitals:   11/06/19 1347  BP: 112/68  Pulse: 81  Temp: 97.6 F (36.4 C)  TempSrc: Temporal  SpO2: 99%  Weight: 212 lb (96.2 kg)  Height: 5\' 7"  (1.702 m)   99% on   RA BMI Readings from Last 3 Encounters:  11/06/19 33.20 kg/m  10/16/19 33.83 kg/m  09/08/19 33.42 kg/m   Wt Readings from Last 3 Encounters:  11/06/19 212 lb (96.2 kg)  10/16/19 216 lb (98 kg)  09/08/19 213 lb 6.4 oz (96.8 kg)    Physical Exam Vitals reviewed.  Constitutional:      General: She is not in acute distress.    Appearance: Normal appearance. She is not ill-appearing.  HENT:     Head: Normocephalic and atraumatic.  Eyes:     General: No scleral icterus. Cardiovascular:     Rate and Rhythm: Normal rate and regular rhythm.     Heart sounds: No murmur. No friction rub.     Comments: No JVD Pulmonary:     Comments: Comfortably in room air, no conversational dyspnea.  Clear to auscultation bilaterally.  No pleural rub. Abdominal:     General: There is no distension.  Musculoskeletal:        General: No swelling or deformity.     Cervical back: Neck supple.  Lymphadenopathy:     Cervical: No cervical adenopathy.  Skin:    General: Skin is warm and dry.     Findings: No rash.  Neurological:     General: No focal deficit present.     Mental Status: She is alert.      Coordination: Coordination normal.  Psychiatric:        Mood and Affect: Mood normal.        Behavior: Behavior normal.      CBC    Component Value Date/Time   WBC 8.6 08/29/2019 1305   RBC 4.91 08/29/2019 1305   HGB 13.7 08/29/2019 1305   HCT 41.1 08/29/2019 1305   PLT 330 08/29/2019 1305   MCV 83.7 08/29/2019 1305   MCH 27.9 08/29/2019 1305   MCHC 33.3 08/29/2019 1305   RDW 13.2 08/29/2019 1305    CHEMISTRY No results for input(s): NA, K, CL, CO2, GLUCOSE, BUN, CREATININE, CALCIUM, MG, PHOS in the last 168 hours. CrCl cannot be calculated (Patient's most recent lab result is older than the maximum 21 days allowed.).  09/04/2019: CRP 16 Sed rate 38  10/16/2019: CRP 8 Sed rate 9  08/18/2019: D-dimer <0.27  Chest Imaging- films reviewed: CXR, 1 view 08/29/2019-normal CXR  Pulmonary Functions Testing Results: PFT Results Latest Ref Rng & Units 11/06/2019  FVC-Pre L 3.53  FVC-Predicted Pre % 100  FVC-Post L 3.71  FVC-Predicted Post % 105  Pre FEV1/FVC % % 84  Post FEV1/FCV % % 87  FEV1-Pre L 2.97  FEV1-Predicted Pre % 100  FEV1-Post L 3.24  DLCO UNC% % 103  DLCO COR %Predicted % 124  TLC L 5.07  TLC % Predicted % 92  RV % Predicted % 91   No significant obstruction or bronchodilator reversibility.  No restriction.  Normal diffusion.  Normal flow volume loop.  Echocardiogram 09/02/2019: LVEF 60 to 65%, normal LA, RV, RA.  Trace MR, otherwise normal valves.  No pericardial effusion.  EKG 08/29/2019- NSR, normal axis.  Normal intervals.  Q waves in inferior and lateral precordial leads.  No ST changes or PR depression.      Assessment & Plan:     ICD-10-CM  1. Other chest pain  R07.89 CT Angio Chest W/Cm &/Or Wo Cm    Basic Metabolic Panel (BMET)    Basic Metabolic Panel (BMET)    Shortness of breath associated with atypical chest pain and reflux symptoms; suspect this is secondary to both of these conditions. Reflux probably explains her rest symptoms  that only seem to occur when laying down. -PFTs reviewed today-no evidence of lung disease contributing to her symptoms. -Given the family history of DVTs and secondary relatives and out of an abundance of precaution given the persistence of pain after improvement of her inflammatory markers, we will obtain a CTA to rule out PE.  I will follow up with her with the results over the phone. -Continue Covid precautions-mask wearing, social distancing, handwashing.  Recommend vaccine when it is available to her.  RTC as needed.    Current Outpatient Medications:  .  colchicine 0.6 MG tablet, Take 1 tablet (0.6 mg total) by mouth daily., Disp: 30 tablet, Rfl: 2 .  metoprolol succinate (TOPROL XL) 25 MG 24 hr tablet, Take 1 tablet (25 mg total) by mouth at bedtime., Disp: 30 tablet, Rfl: 3 .  naproxen (NAPROSYN) 500 MG tablet, Take 1 tablet (500 mg total) by mouth 2 (two) times daily., Disp: 14 tablet, Rfl: 0 .  norgestimate-ethinyl estradiol (ORTHO-CYCLEN,SPRINTEC,PREVIFEM) 0.25-35 MG-MCG tablet, Take 1 tablet by mouth daily., Disp: 3 Package, Rfl: 3 .  omeprazole (PRILOSEC) 20 MG capsule, Take 1 capsule (20 mg total) by mouth daily., Disp: 30 capsule, Rfl: 11 .  pantoprazole (PROTONIX) 40 MG tablet, Take 1 tablet (40 mg total) by mouth daily. Take while taking ibuprofen., Disp: 30 tablet, Rfl: 1   Julian Hy, DO [facility] 11/06/2019 2:19 PM

## 2019-11-07 ENCOUNTER — Other Ambulatory Visit: Payer: Self-pay

## 2019-11-07 ENCOUNTER — Ambulatory Visit (INDEPENDENT_AMBULATORY_CARE_PROVIDER_SITE_OTHER)
Admission: RE | Admit: 2019-11-07 | Discharge: 2019-11-07 | Disposition: A | Discharge status: Home / Self Care | Payer: Self-pay | Source: Ambulatory Visit | Attending: Critical Care Medicine | Admitting: Critical Care Medicine

## 2019-11-07 DIAGNOSIS — R0789 Other chest pain: Secondary | ICD-10-CM

## 2019-11-07 MED ORDER — IOHEXOL 350 MG/ML SOLN
80.0000 mL | Freq: Once | INTRAVENOUS | Status: AC | PRN
  Administered 2019-11-07: 80 mL via INTRAVENOUS

## 2019-11-12 ENCOUNTER — Other Ambulatory Visit: Payer: Self-pay | Admitting: Internal Medicine

## 2019-11-12 ENCOUNTER — Ambulatory Visit (INDEPENDENT_AMBULATORY_CARE_PROVIDER_SITE_OTHER): Payer: Self-pay | Admitting: Internal Medicine

## 2019-11-12 ENCOUNTER — Other Ambulatory Visit: Payer: Self-pay

## 2019-11-12 ENCOUNTER — Encounter: Payer: Self-pay | Admitting: Internal Medicine

## 2019-11-12 VITALS — BP 109/70 | HR 75 | Temp 97.3°F | Ht 67.0 in | Wt 215.6 lb

## 2019-11-12 DIAGNOSIS — R079 Chest pain, unspecified: Secondary | ICD-10-CM

## 2019-11-12 DIAGNOSIS — R002 Palpitations: Secondary | ICD-10-CM

## 2019-11-12 NOTE — Progress Notes (Signed)
OFFICE NOTE  Chief Complaint:  Follow-up  Primary Care Physician: Default, Provider, MD  HPI:  Robin Humphrey is a 34 y.o. female with no significant past medical history.  Over the past several weeks she has developed some chest discomfort and shortness of breath.  She notes it is worse when laying down at night and has been having some associated tachycardia with exertion.  She was seen by urgent care is felt that is possibly anxiety.  She was given some Xanax which she took when she was symptomatic however it just made her tired.  Subsequently she presented to the ER 2 days ago with more symptoms.  She described it is fairly constant however it does wax and wane some.  It seems to be a little worse when laying down.  Is not necessarily worse with exertion or relieved by rest.  There is no family history of early onset heart disease.  She has no known significant risk factors.  She works at [employer] and is under some stress and has a child with autism at home.  She generally follows good pelvic health practices by wearing a mask.  She denies any recent infective symptoms, fevers or generalized malaise.  EKG performed at the hospital was personally reviewed and showed a sinus tachycardia without ST segment changes.  Chest x-ray showed no active cardiopulmonary disease.  Troponins were negative and her D-dimer was negative.  While in the ER she was started on naproxen with a plan for 1 week course of 500 mg twice daily.  This is reasonable treatment as her symptoms might be musculoskeletal chest pain, costochondritis or possibly pericarditis.  11/12/2019  Robin Humphrey returns today for follow-up.  She reports her chest wall discomfort has improved.  She has been taking colchicine now almost 3 months.  She took 2 weeks of naproxen.  She still has some soreness from time to time however the symptoms are improved.  She is also noted improvement in her palpitations on metoprolol.  At first she was  fatigued with this however this improved and she takes it mostly at night.  PMHx:  Past Medical History:  Diagnosis Date  . Ovarian cyst     Past Surgical History:  Procedure Laterality Date  . CESAREAN SECTION      FAMHx:  Family History  Problem Relation Age of Onset  . CAD Other   . Diabetes Mother   . Asthma Sister   . Asthma Brother   . Heart disease Maternal Aunt   . Stroke Maternal Grandmother   . Heart disease Maternal Grandmother     No family history of early coronary disease  SOCHx:   reports that she has never smoked. She has never used smokeless tobacco. She reports current alcohol use. She reports previous drug use.  ALLERGIES:  No Known Allergies  ROS: Pertinent items noted in HPI and remainder of comprehensive ROS otherwise negative.  HOME MEDS: Current Outpatient Medications on File Prior to Visit  Medication Sig Dispense Refill  . colchicine 0.6 MG tablet Take 1 tablet (0.6 mg total) by mouth daily. 30 tablet 2  . metoprolol succinate (TOPROL XL) 25 MG 24 hr tablet Take 1 tablet (25 mg total) by mouth at bedtime. 30 tablet 3  . naproxen (NAPROSYN) 500 MG tablet Take 1 tablet (500 mg total) by mouth 2 (two) times daily. 14 tablet 0  . norgestimate-ethinyl estradiol (ORTHO-CYCLEN,SPRINTEC,PREVIFEM) 0.25-35 MG-MCG tablet Take 1 tablet by mouth daily. 3 Package 3  .  omeprazole (PRILOSEC) 20 MG capsule Take 1 capsule (20 mg total) by mouth daily. 30 capsule 11  . pantoprazole (PROTONIX) 40 MG tablet Take 1 tablet (40 mg total) by mouth daily. Take while taking ibuprofen. 30 tablet 1   No current facility-administered medications on file prior to visit.    LABS/IMAGING: No results found for this or any previous visit (from the past 48 hour(s)). No results found.  LIPID PANEL: No results found for: CHOL, TRIG, HDL, CHOLHDL, VLDL, LDLCALC, LDLDIRECT   WEIGHTS: Wt Readings from Last 3 Encounters:  11/12/19 215 lb 9.6 oz (97.8 kg)  11/06/19 212 lb  (96.2 kg)  10/16/19 216 lb (98 kg)    VITALS: BP 109/70   Pulse 75   Temp (!) 97.3 F (36.3 C)   Ht 5\' 7"  (1.702 m)   Wt 215 lb 9.6 oz (97.8 kg)   LMP 10/17/2019   SpO2 99%   BMI 33.77 kg/m   EXAM: General appearance: alert and no distress Neck: no carotid bruit, no JVD and thyroid not enlarged, symmetric, no tenderness/mass/nodules Lungs: clear to auscultation bilaterally Heart: regular rate and rhythm, S1, S2 normal, no murmur, click, rub or gallop Abdomen: soft, non-tender; bowel sounds normal; no masses,  no organomegaly Extremities: extremities normal, atraumatic, no cyanosis or edema Pulses: 2+ and symmetric Skin: Skin color, texture, turgor normal. No rashes or lesions Neurologic: Grossly normal Psych: Pleasant  EKG: Deferred  ASSESSMENT: 1. Atypical chest pain, worse lying down 2. Possible pericarditis or costochondritis 3. Palpitations  PLAN: 1.   [name] has generally had improvement in her chest discomfort and is just about to finish her colchicine.  I advised her to discontinue it when she completes it.  She reports good control of her palpitations.  We will continue metoprolol.  Plan follow-up with Doreene Adas, PA-C in 1 year.  Pixie Casino, MD, [facility], [place] Director of the Advanced Lipid Disorders &  Cardiovascular Risk Reduction Clinic Diplomate of the American Board of Clinical Lipidology Attending Cardiologist  Direct Dial: [phone]  Fax: [phone]  Website:  [email or web address] 11/12/2019, 8:05 AM

## 2019-11-12 NOTE — Patient Instructions (Signed)
Medication Instructions:  STOP colchicine when you run out of the prescription   *If you need a refill on your cardiac medications before your next appointment, please call your pharmacy*  Lab Work: NONE If you have labs (blood work) drawn today and your tests are completely normal, you will receive your results only by: Marland Kitchen MyChart Message (if you have MyChart) OR . A paper copy in the mail If you have any lab test that is abnormal or we need to change your treatment, we will call you to review the results.  Testing/Procedures: NONE  Follow-Up: At [facility], you and your health needs are our priority.  As part of our continuing mission to provide you with exceptional heart care, we have created designated Provider Care Teams.  These Care Teams include your primary Cardiologist (physician) and Advanced Practice Providers (APPs -  Physician Assistants and Nurse Practitioners) who all work together to provide you with the care you need, when you need it.  Your next appointment:   12 month(s)  The format for your next appointment:   Either In Person or Virtual  Provider:   You may see Chrystie Nose, MD or one of the following Advanced Practice Providers on your designated Care Team:    Azalee Course, PA-C  Micah Flesher, PA-C or   Judy Pimple, [place]   Other Instructions

## 2019-11-13 MED ORDER — PANTOPRAZOLE SODIUM 40 MG PO TBEC: 40.0000 mg | DELAYED_RELEASE_TABLET | Freq: Every day | ORAL | 1 refills | Status: DC

## 2019-11-27 ENCOUNTER — Other Ambulatory Visit: Payer: Self-pay

## 2019-11-27 ENCOUNTER — Encounter: Payer: Self-pay | Admitting: Obstetrics and Gynecology

## 2019-11-27 ENCOUNTER — Other Ambulatory Visit (HOSPITAL_COMMUNITY)
Admission: RE | Admit: 2019-11-27 | Discharge: 2019-11-27 | Disposition: A | Discharge status: Home / Self Care | Payer: Medicaid Other | Source: Ambulatory Visit | Attending: Obstetrics and Gynecology | Admitting: Obstetrics and Gynecology

## 2019-11-27 ENCOUNTER — Ambulatory Visit (INDEPENDENT_AMBULATORY_CARE_PROVIDER_SITE_OTHER): Payer: Medicaid Other | Admitting: Obstetrics and Gynecology

## 2019-11-27 VITALS — BP 125/71 | HR 74 | Ht 67.0 in | Wt 214.9 lb

## 2019-11-27 DIAGNOSIS — Z30016 Encounter for initial prescription of transdermal patch hormonal contraceptive device: Secondary | ICD-10-CM | POA: Diagnosis not present

## 2019-11-27 DIAGNOSIS — Z01419 Encounter for gynecological examination (general) (routine) without abnormal findings: Secondary | ICD-10-CM

## 2019-11-27 DIAGNOSIS — Z0000 Encounter for general adult medical examination without abnormal findings: Secondary | ICD-10-CM | POA: Diagnosis not present

## 2019-11-27 DIAGNOSIS — N83202 Unspecified ovarian cyst, left side: Secondary | ICD-10-CM | POA: Diagnosis not present

## 2019-11-27 LAB — POCT PREGNANCY, URINE: Preg Test, Ur: NEGATIVE

## 2019-11-27 MED ORDER — NORELGESTROMIN-ETH ESTRADIOL 150-35 MCG/24HR TD PTWK: 1.0000 | MEDICATED_PATCH | TRANSDERMAL | 3 refills | Status: DC

## 2019-11-27 NOTE — Progress Notes (Signed)
Obstetrics and Gynecology Annual Patient Evaluation  Appointment Date: 11/27/2019  OBGYN Clinic: [facility]  Primary Care Provider: Default, Provider   Chief Complaint:  Chief Complaint  Patient presents with  . Gynecologic Exam    History of Present Illness: Robin Humphrey is a 34 y.o. African-American [id number] (Patient's last menstrual period was 11/17/2019 (approximate).), seen for the above chief complaint. Her past medical history is significant for BMI 30s, h/o c-section x 2   No breast s/s, nausea, vomiting, abdominal pain, dysuria, hematuria, diarrhea, constipation, blood in BMs  Review of Systems: Pertinent items noted in HPI and remainder of comprehensive ROS otherwise negative.   Past Medical History:  Past Medical History:  Diagnosis Date  . Ovarian cyst     Past Surgical History:  Past Surgical History:  Procedure Laterality Date  . CESAREAN SECTION      Past Obstetrical History:  OB History  Gravida Para Term Preterm AB Living  4       2 2   SAB TAB Ectopic Multiple Live Births  1 1     2     # Outcome Date GA Lbr Len/2nd Weight Sex Delivery Anes PTL Lv  4 Gravida 2010     CS-Unspec     3 Gravida 2009     CS-Unspec     2 TAB           1 SAB             Past Gynecological History: As per HPI. Periods: qmonth, regular, 5 days, somewhat heavy and painful History of Pap Smear(s): Yes.   Last pap 2020, which was negative She is currently using no method for contraception.  Last sex: this past weekend  Social History:  Social History   Socioeconomic History  . Marital status: Single    Spouse name: Not on file  . Number of children: Not on file  . Years of education: Not on file  . Highest education level: Not on file  Occupational History  . Not on file  Tobacco Use  . Smoking status: Never Smoker  . Smokeless tobacco: Never Used  Substance and Sexual Activity  . Alcohol use: Yes    Comment: occasionally  . Drug use:  Not Currently  . Sexual activity: Yes    Birth control/protection: None  Other Topics Concern  . Not on file  Social History Narrative  . Not on file   Social Determinants of Health   Financial Resource Strain:   . Difficulty of Paying Living Expenses: Not on file  Food Insecurity:   . Worried About 2010 in the Last Year: Not on file  . Ran Out of Food in the Last Year: Not on file  Transportation Needs:   . Lack of Transportation (Medical): Not on file  . Lack of Transportation (Non-Medical): Not on file  Physical Activity:   . Days of Exercise per Week: Not on file  . Minutes of Exercise per Session: Not on file  Stress:   . Feeling of Stress : Not on file  Social Connections:   . Frequency of Communication with Friends and Family: Not on file  . Frequency of Social Gatherings with Friends and Family: Not on file  . Attends Religious Services: Not on file  . Active Member of Clubs or Organizations: Not on file  . Attends 2021 Meetings: Not on file  . Marital Status: Not on file  Intimate Partner Violence:   .  Fear of Current or Ex-Partner: Not on file  . Emotionally Abused: Not on file  . Physically Abused: Not on file  . Sexually Abused: Not on file    Family History:  Family History  Problem Relation Age of Onset  . CAD Other   . Diabetes Mother   . Asthma Sister   . Asthma Brother   . Heart disease Maternal Aunt   . Stroke Maternal Grandmother   . Heart disease Maternal Grandmother    Medications Robin Humphrey had no medications administered during this visit. Current Outpatient Medications  Medication Sig Dispense Refill  . colchicine 0.6 MG tablet Take 1 tablet (0.6 mg total) by mouth daily. 30 tablet 2  . metoprolol succinate (TOPROL XL) 25 MG 24 hr tablet Take 1 tablet (25 mg total) by mouth at bedtime. 30 tablet 3  . naproxen (NAPROSYN) 500 MG tablet Take 1 tablet (500 mg total) by mouth 2 (two) times daily. 14 tablet 0  .  omeprazole (PRILOSEC) 20 MG capsule Take 1 capsule (20 mg total) by mouth daily. 30 capsule 11  . pantoprazole (PROTONIX) 40 MG tablet Take 1 tablet (40 mg total) by mouth daily. Take while taking ibuprofen. 30 tablet 1  . norgestimate-ethinyl estradiol (ORTHO-CYCLEN,SPRINTEC,PREVIFEM) 0.25-35 MG-MCG tablet Take 1 tablet by mouth daily. (Patient not taking: Reported on 11/27/2019) 3 Package 3   No current facility-administered medications for this visit.    Allergies Patient has no known allergies.   Physical Exam:  BP 125/71   Pulse 74   Ht 5\' 7"  (1.702 m)   Wt 214 lb 14.4 oz (97.5 kg)   LMP 11/17/2019 (Approximate)   BMI 33.66 kg/m  Body mass index is 33.66 kg/m. General appearance: Well nourished, well developed female in no acute distress.  Neck:  Supple, normal appearance, and no thyromegaly  Cardiovascular: normal s1 and s2.  No murmurs, rubs or gallops. Respiratory:  Clear to auscultation bilateral. Normal respiratory effort Abdomen: positive bowel sounds and no masses, hernias; diffusely non tender to palpation, non distended. Well healed low transverse skin incision Breasts: pt denies any breast s/s Neuro/Psych:  Normal mood and affect.  Skin:  Warm and dry.  Lymphatic:  No inguinal lymphadenopathy.   Pelvic exam: is not limited by body habitus EGBUS: within normal limits Vagina: within normal limits and with no blood or discharge in the vault Cervix: normal appearing cervix without tenderness, discharge or lesions.  Uterus:  nonenlarged and non tender Adnexa:  normal adnexa and no mass, fullness, tenderness Rectovaginal: deferred  Laboratory: upt negative  Radiology: no new imaging CLINICAL DATA:  Chronic pelvic pain  EXAM: TRANSABDOMINAL AND TRANSVAGINAL ULTRASOUND OF PELVIS  TECHNIQUE: Both transabdominal and transvaginal ultrasound examinations of the pelvis were performed. Transabdominal technique was performed for global imaging of the pelvis including  uterus, ovaries, adnexal regions, and pelvic cul-de-sac. It was necessary to proceed with endovaginal exam following the transabdominal exam to visualize the endometrium and adnexa.  COMPARISON:  None  FINDINGS: Uterus  Measurements: 9.8 x 5.5 x 6.9 cm = volume: 196 mL. Normal morphology without mass  Endometrium  Thickness: 15 mm. Heterogeneous appearance hypoechoic area 12 x 14 x 4 mm which could represent a small amount of blood. Remainder of the endometrium appears heterogeneous as well.  Right ovary  Measurements: 3.7 x 3.2 x 3.9 cm = volume: 23.7 mL. Normal morphology without mass. Blood flow present within RIGHT ovary on color Doppler imaging.  Left ovary  Measurements: 3.9 x 2.8  x 3.5 cm = volume: 20.5 mL. Small heterogenous nodule question hemorrhagic cyst measuring 1.9 x 1.4 x 1.5 cm. Blood flow present within LEFT ovary on color Doppler imaging.  Other findings  No free pelvic fluid.  No additional adnexal masses.  IMPRESSION: Heterogeneous appearance of the endometrial complex with a small hypoechoic collection 12 x 14 x 4 mm question endometrial blood.  Small probable hemorrhagic cyst of the LEFT ovary 1.9 cm diameter; short-interval follow up ultrasound in 6-12 weeks is recommended, preferably during the week following the patient's normal menses.   Electronically Signed   By: Ulyses Southward M.D.   On: 11/05/2018 09:51  Assessment: pt doing well  Plan:  1. Women's annual routine gynecological examination Routine care. Pt would like one more child. BC options d/w her. She had weight gain with depo but did well with the patch. She didn't start the ocps last year due to nausea. Options reviewed with her; pt doesn't smoke. She'd like to do the patch again. Pt told to start patch after next period - Cytology - PAP  2. Left ovarian cyst Pt asymptomatic and images reviewed and small size, no s/s, and looked very characteristic of HC. No need  for further imaging  Orders Placed This Encounter  Procedures  . Pregnancy, urine POC    RTC 1 year  [address] Bing, Montez Hageman MD Attending Center for [facility])

## 2019-12-02 LAB — CYTOLOGY - PAP
Chlamydia: NEGATIVE
Comment: NEGATIVE
Comment: NEGATIVE
Comment: NORMAL
Diagnosis: NEGATIVE
High risk HPV: NEGATIVE
Neisseria Gonorrhea: NEGATIVE

## 2020-01-10 ENCOUNTER — Ambulatory Visit (INDEPENDENT_AMBULATORY_CARE_PROVIDER_SITE_OTHER)
Admission: RE | Admit: 2020-01-10 | Discharge: 2020-01-10 | Disposition: A | Discharge status: Home / Self Care | Payer: Medicaid Other | Source: Ambulatory Visit

## 2020-01-10 DIAGNOSIS — K0889 Other specified disorders of teeth and supporting structures: Secondary | ICD-10-CM

## 2020-01-10 MED ORDER — AMOXICILLIN 500 MG PO CAPS: 500.0000 mg | ORAL_CAPSULE | Freq: Two times a day (BID) | ORAL | 0 refills | Status: AC

## 2020-01-10 NOTE — ED Provider Notes (Addendum)
Virtual Visit via Video Note:  Colin Norment  initiated request for Telemedicine visit with [facility] team. I connected with [name]  on 01/10/2020 at 9:52 AM  for a synchronized telemedicine visit using a video enabled HIPPA compliant telemedicine application. I verified that I am speaking with [name]  using two identifiers. [place] Hall-Potvin, PA-C  was physically located in a [facility] Urgent care site and Alayzha An was located at a different location.   The limitations of evaluation and management by telemedicine as well as the availability of in-person appointments were discussed. Patient was informed that she  may incur a bill ( including co-pay) for this virtual visit encounter. Lelani Garnett  expressed understanding and gave verbal consent to proceed with virtual visit.     History of Present Illness:Robin Humphrey  is a 34 y.o. female presents with left bottom rear molar pain.  Patient states is been ongoing for a while, worse last 2 days.  States she is taken Tylenol with some relief.  Patient does have history of dental abscess, though states this is not as bad as that time.  Patient states she does have gum tenderness and pain with chewing.  Denies ear pain, throat pain, fever.   ROI as per HPI  Past Medical History:  Diagnosis Date  . Ovarian cyst     No Known Allergies      Observations/Objective: 34 year old female Sitting in no acute distress.  Patient is able to speak in full sentences without coughing, sneezing, wheezing.  Patient able to palpate gumline: Tender, nonfluctuant.  Patient denies halitosis, discharge.  Assessment and Plan: Dental pain concerning for infection given patient's history and pain.  Will start antibiotics, have patient follow-up with dentist next week.  Return precautions discussed, patient verbalized understanding and is agreeable to plan.  Follow Up Instructions: Patient to follow-up with her dentist next week.   I  discussed the assessment and treatment plan with the patient. The patient was provided an opportunity to ask questions and all were answered. The patient agreed with the plan and demonstrated an understanding of the instructions.   The patient was advised to call back or seek an in-person evaluation if the symptoms worsen or if the condition fails to improve as anticipated.  I provided 15 minutes of non-face-to-face time during this encounter.    [place] Hall-Potvin, PA-C  01/10/2020 9:52 AM        Hall-Potvin, [place], PA-C 01/10/20 0951    Odette Fraction [place], [place] 01/10/20 [id number]

## 2020-01-10 NOTE — Discharge Instructions (Addendum)
Take antibiotic twice daily with food. Important to schedule follow-up with your dentist next week for further evaluation/management. Return for worsening pain, swelling, fever, difficulty breathing or swallowing.

## 2020-02-09 ENCOUNTER — Other Ambulatory Visit: Payer: Self-pay

## 2020-02-09 DIAGNOSIS — R079 Chest pain, unspecified: Secondary | ICD-10-CM

## 2020-02-09 MED ORDER — PANTOPRAZOLE SODIUM 40 MG PO TBEC: 40.0000 mg | DELAYED_RELEASE_TABLET | Freq: Every day | ORAL | 2 refills | Status: DC

## 2020-02-10 ENCOUNTER — Other Ambulatory Visit: Payer: Self-pay

## 2020-02-10 DIAGNOSIS — R079 Chest pain, unspecified: Secondary | ICD-10-CM

## 2020-02-10 MED ORDER — PANTOPRAZOLE SODIUM 40 MG PO TBEC: 40.0000 mg | DELAYED_RELEASE_TABLET | Freq: Every day | ORAL | 3 refills | Status: DC

## 2020-03-09 ENCOUNTER — Other Ambulatory Visit: Payer: Self-pay | Admitting: *Deleted

## 2020-03-09 MED ORDER — METOPROLOL SUCCINATE ER 25 MG PO TB24: 25.0000 mg | ORAL_TABLET | Freq: Every day | ORAL | 3 refills | Status: DC

## 2020-09-14 MED ORDER — METOPROLOL SUCCINATE ER 25 MG PO TB24: 25.0000 mg | ORAL_TABLET | Freq: Every day | ORAL | 3 refills | Status: DC

## 2020-10-19 ENCOUNTER — Other Ambulatory Visit: Payer: Self-pay

## 2020-10-19 DIAGNOSIS — Z3045 Encounter for surveillance of transdermal patch hormonal contraceptive device: Secondary | ICD-10-CM

## 2020-10-19 MED ORDER — NORELGESTROMIN-ETH ESTRADIOL 150-35 MCG/24HR TD PTWK: 1.0000 | MEDICATED_PATCH | TRANSDERMAL | 0 refills | Status: DC

## 2020-10-21 ENCOUNTER — Other Ambulatory Visit: Payer: Self-pay | Admitting: Obstetrics and Gynecology

## 2020-10-21 DIAGNOSIS — Z3045 Encounter for surveillance of transdermal patch hormonal contraceptive device: Secondary | ICD-10-CM

## 2020-11-03 NOTE — Progress Notes (Signed)
Cardiology Office Note:    Date:  11/16/2020   ID:  Robin Humphrey, DOB [birth year], MRN [id number]  PCP:  Default, Provider, MD  Cardiologist:  Chrystie Nose, MD   Referring MD: No ref. provider found   Chief Complaint  Patient presents with  . Follow-up  chest pain, palpitations  History of Present Illness:    Robin Humphrey is a 35 y.o. female with no prior medical history establish care with Dr. Rennis Golden on 08/20/2019 following an ER visit for some atypical chest pain.  In the ER she ruled out with negative troponins and a nonischemic EKG.  Her D-dimer was also negative.  She was started on naproxen 500 mg twice daily, which helped her chest pain.  Differential included MSK chest pain, costochondritis, or pericarditis.  Echocardiogram on 09/02/2019 and revealed normal EF, no valvular disease, and no pericardial effusion.  Unfortunately she presented back to the ER with complaints of chest pain on 08/29/2019.  She again ruled out for ACS.  BNP and lipase were normal.  EDP suspected a component of anxiety and continued her benzodiazepine.  He also referred her to pulmonology.  I saw her in clinic on 09/04/19. Of note, she described palpitations which coincided with the chest pain and rapid heart rate that was waking her from sleep. In response, I placed a 14-day zio patch which showed sinus rhythm with a single 6-beat run of SVT, but otherwise no ectopy. TSH was normal.  CRP and sed rate were mildly elevated. Case discussed with Dr. Rennis Golden. I ordered colchicine and motrin for suspected pericarditis and she reported relief of her chest pain via phone call on 09/17/19.   She presents back today for follow up. She does report chest pain for 2-3 days last week. CP was resolved for a couple of months after finishing colchicine. However, CP started occurring again - she had chest pain about monthly. Last week, CP occurred daily for 3 days. CP was intermittent and at times radiated down her left arm, associated  with SOB. CP and SOB resolved when she rested in bed. CP located on her right chest wall, similar to her prior pericarditis.  Ibuprofen helped her chest pain. CP does not occur when she walks with her job at [employer]. CP is not pleuritic. No cough. She also reported an episode of rapid heart rate associated with stress. She denies recent illness, but sinus congestion with mask wearing.  CP and palpitations seem to occur together. She takes ibuprofen occasionally, but not regularly. She is still taking protonix.   Past Medical History:  Diagnosis Date  . Ovarian cyst     Past Surgical History:  Procedure Laterality Date  . CESAREAN SECTION      Current Medications: Current Meds  Medication Sig  . metoprolol succinate (TOPROL XL) 25 MG 24 hr tablet Take 1 tablet (25 mg total) by mouth at bedtime.  . norelgestromin-ethinyl estradiol (ORTHO EVRA) 150-35 MCG/24HR transdermal patch Place 1 patch onto the skin once a week. Use patch once a week for three weeks; leave patch off for 1 week and begin cycle again.  . pantoprazole (PROTONIX) 40 MG tablet Take 1 tablet (40 mg total) by mouth daily. Take while taking ibuprofen.  . [DISCONTINUED] omeprazole (PRILOSEC) 20 MG capsule Take 1 capsule (20 mg total) by mouth daily.     Allergies:   Patient has no known allergies.   Social History   Socioeconomic History  . Marital status: Single  Spouse name: Not on file  . Number of children: Not on file  . Years of education: Not on file  . Highest education level: Not on file  Occupational History  . Not on file  Tobacco Use  . Smoking status: Never Smoker  . Smokeless tobacco: Never Used  Vaping Use  . Vaping Use: Never used  Substance and Sexual Activity  . Alcohol use: Yes    Comment: occasionally  . Drug use: Not Currently  . Sexual activity: Yes    Birth control/protection: None  Other Topics Concern  . Not on file  Social History Narrative  . Not on file   Social  Determinants of Health   Financial Resource Strain: Not on file  Food Insecurity: Not on file  Transportation Needs: Not on file  Physical Activity: Not on file  Stress: Not on file  Social Connections: Not on file     Family History: The patient's family history includes Asthma in her brother and sister; CAD in an other family member; Diabetes in her mother; Heart disease in her maternal aunt and maternal grandmother; Stroke in her maternal grandmother.  ROS:   Please see the history of present illness.     All other systems reviewed and are negative.  EKGs/Labs/Other Studies Reviewed:    The following studies were reviewed today:  14 day zio patch 10/08/19: Sinus rhythm with one short six beat run of SVT. Otherwise no significant ectopy.   Echo 09/02/19: 1. Left ventricular ejection fraction, by visual estimation, is 60 to 65%. The left ventricle has normal function. There is no left ventricular hypertrophy. 2. Global right ventricle has normal systolic function.The right ventricular size is normal. No increase in right ventricular wall thickness. 3. Left atrial size was normal. 4. Right atrial size was normal. 5. The mitral valve is normal in structure. Trace mitral valve regurgitation. No evidence of mitral stenosis. 6. The tricuspid valve is normal in structure. Tricuspid valve regurgitation is not demonstrated. 7. The aortic valve is normal in structure. Aortic valve regurgitation is not visualized. No evidence of aortic valve sclerosis or stenosis. 8. The pulmonic valve was normal in structure. Pulmonic valve regurgitation is not visualized. 9. The atrial septum is grossly normal. 10. The average left ventricular global longitudinal strain is -21.3 %.  EKG:  EKG is ordered today.  The ekg ordered today demonstrates sinus rhythm HR 66  Recent Labs: No results found for requested labs within last 8760 hours.  Recent Lipid Panel No results found for: CHOL, TRIG,  HDL, CHOLHDL, VLDL, LDLCALC, LDLDIRECT  Physical Exam:    VS:  BP 127/78   Pulse 73   Ht 5\' 7"  (1.702 m)   Wt 220 lb (99.8 kg)   BMI 34.46 kg/m     Wt Readings from Last 3 Encounters:  11/16/20 220 lb (99.8 kg)  11/27/19 214 lb 14.4 oz (97.5 kg)  11/12/19 215 lb 9.6 oz (97.8 kg)     GEN: Well nourished, well developed in no acute distress HEENT: Normal NECK: No JVD; No carotid bruits LYMPHATICS: No lymphadenopathy CARDIAC: RRR, no murmurs, rubs, gallops RESPIRATORY:  Clear to auscultation without rales, wheezing or rhonchi  ABDOMEN: Soft, non-tender, non-distended MUSCULOSKELETAL:  No edema; No deformity  SKIN: Warm and dry NEUROLOGIC:  Alert and oriented x 3 PSYCHIATRIC:  Normal affect   ASSESSMENT:    1. Chest pain, unspecified type   2. Idiopathic pericarditis, unspecified chronicity   3. Palpitations   4. PSVT (  paroxysmal supraventricular tachycardia) (HCC)    PLAN:    In order of problems listed above:  Hx of pericarditis with elevated CRP and sed rate Chest pain - echocardiogram  07/2019 with normal structure and function - mildly elevated sed rate and CRP --> completed 3 months of colchicine 12/2019 - she presents back with another bout of chest pain - will draw sed rate and CRP  - EKG today is nonischemic, stable from prior tracing - if sed rate and CRP elevated --> restart colchicine - if normal, consider rechecking an echocardiogram - will also check TSH and CBC   Palpitations  PSVT - zio patch with an isolated 6-beat run of SVT - continue 25 mg toprol - she does report palpitations with chest pain and associated with stress - advised that she is able to take an extra toprol if needed for sustained palpitations - will check TSH and CBC    Medication Adjustments/Labs and Tests Ordered: Current medicines are reviewed at length with the patient today.  Concerns regarding medicines are outlined above.  Orders Placed This Encounter  Procedures  .  CBC  . C-reactive protein  . TSH  . Sedimentation rate  . EKG 12-Lead   No orders of the defined types were placed in this encounter.   Signed, Marcelino Duster, [place]  11/16/2020 8:39 AM    [facility]

## 2020-11-16 ENCOUNTER — Other Ambulatory Visit: Payer: Self-pay

## 2020-11-16 ENCOUNTER — Encounter: Payer: Self-pay | Admitting: Physician Assistant

## 2020-11-16 ENCOUNTER — Ambulatory Visit (INDEPENDENT_AMBULATORY_CARE_PROVIDER_SITE_OTHER): Payer: Medicaid Other | Admitting: Physician Assistant

## 2020-11-16 VITALS — BP 127/78 | HR 73 | Ht 67.0 in | Wt 220.0 lb

## 2020-11-16 DIAGNOSIS — I471 Supraventricular tachycardia, unspecified: Secondary | ICD-10-CM

## 2020-11-16 DIAGNOSIS — I300 Acute nonspecific idiopathic pericarditis: Secondary | ICD-10-CM | POA: Insufficient documentation

## 2020-11-16 DIAGNOSIS — R079 Chest pain, unspecified: Secondary | ICD-10-CM

## 2020-11-16 DIAGNOSIS — R002 Palpitations: Secondary | ICD-10-CM

## 2020-11-16 HISTORY — DX: Supraventricular tachycardia, unspecified: I47.10

## 2020-11-16 NOTE — Patient Instructions (Signed)
Medication Instructions:  No Changes *If you need a refill on your cardiac medications before your next appointment, please call your pharmacy*   Lab Work: [facility] If you have labs (blood work) drawn today and your tests are completely normal, you will receive your results only by: Marland Kitchen MyChart Message (if you have MyChart) OR . A paper copy in the mail If you have any lab test that is abnormal or we need to change your treatment, we will call you to review the results.   Testing/Procedures: No Testing   Follow-Up: At [facility], you and your health needs are our priority.  As part of our continuing mission to provide you with exceptional heart care, we have created designated Provider Care Teams.  These Care Teams include your primary Cardiologist (physician) and Advanced Practice Providers (APPs -  Physician Assistants and Nurse Practitioners) who all work together to provide you with the care you need, when you need it.    Your next appointment:   1 month(s)  The format for your next appointment:   Virtual Visit   Provider:   Fabian Sharp PA-C

## 2020-11-17 LAB — CBC
Hematocrit: 38.3 % (ref 34.0–46.6)
Hemoglobin: 12.5 g/dL (ref 11.1–15.9)
MCH: 26.7 pg (ref 26.6–33.0)
MCHC: 32.6 g/dL (ref 31.5–35.7)
MCV: 82 fL (ref 79–97)
Platelets: 344 10*3/uL (ref 150–450)
RBC: 4.68 x10E6/uL (ref 3.77–5.28)
RDW: 13.5 % (ref 11.7–15.4)
WBC: 7.3 10*3/uL (ref 3.4–10.8)

## 2020-11-17 LAB — C-REACTIVE PROTEIN: CRP: 13 mg/L — ABNORMAL HIGH (ref 0–10)

## 2020-11-17 LAB — SEDIMENTATION RATE: Sed Rate: 10 mm/hr (ref 0–32)

## 2020-11-17 LAB — TSH: TSH: 1.23 u[IU]/mL (ref 0.450–4.500)

## 2020-11-19 ENCOUNTER — Other Ambulatory Visit: Payer: Self-pay | Admitting: *Deleted

## 2020-11-19 DIAGNOSIS — I300 Acute nonspecific idiopathic pericarditis: Secondary | ICD-10-CM

## 2020-11-19 MED ORDER — COLCHICINE 0.6 MG PO TABS: 0.6000 mg | ORAL_TABLET | Freq: Every day | ORAL | 0 refills | Status: DC

## 2020-11-19 MED ORDER — IBUPROFEN 600 MG PO TABS: 600.0000 mg | ORAL_TABLET | Freq: Three times a day (TID) | ORAL | 0 refills | Status: AC

## 2020-12-19 IMAGING — DX DG CHEST 2V
2 series · 2 of 2 positions shown · non-contrast
Comparison: Chest radiograph 04/30/2019

CLINICAL DATA: Chest pain.

EXAM:
CHEST - 2 VIEW

[chest pa]
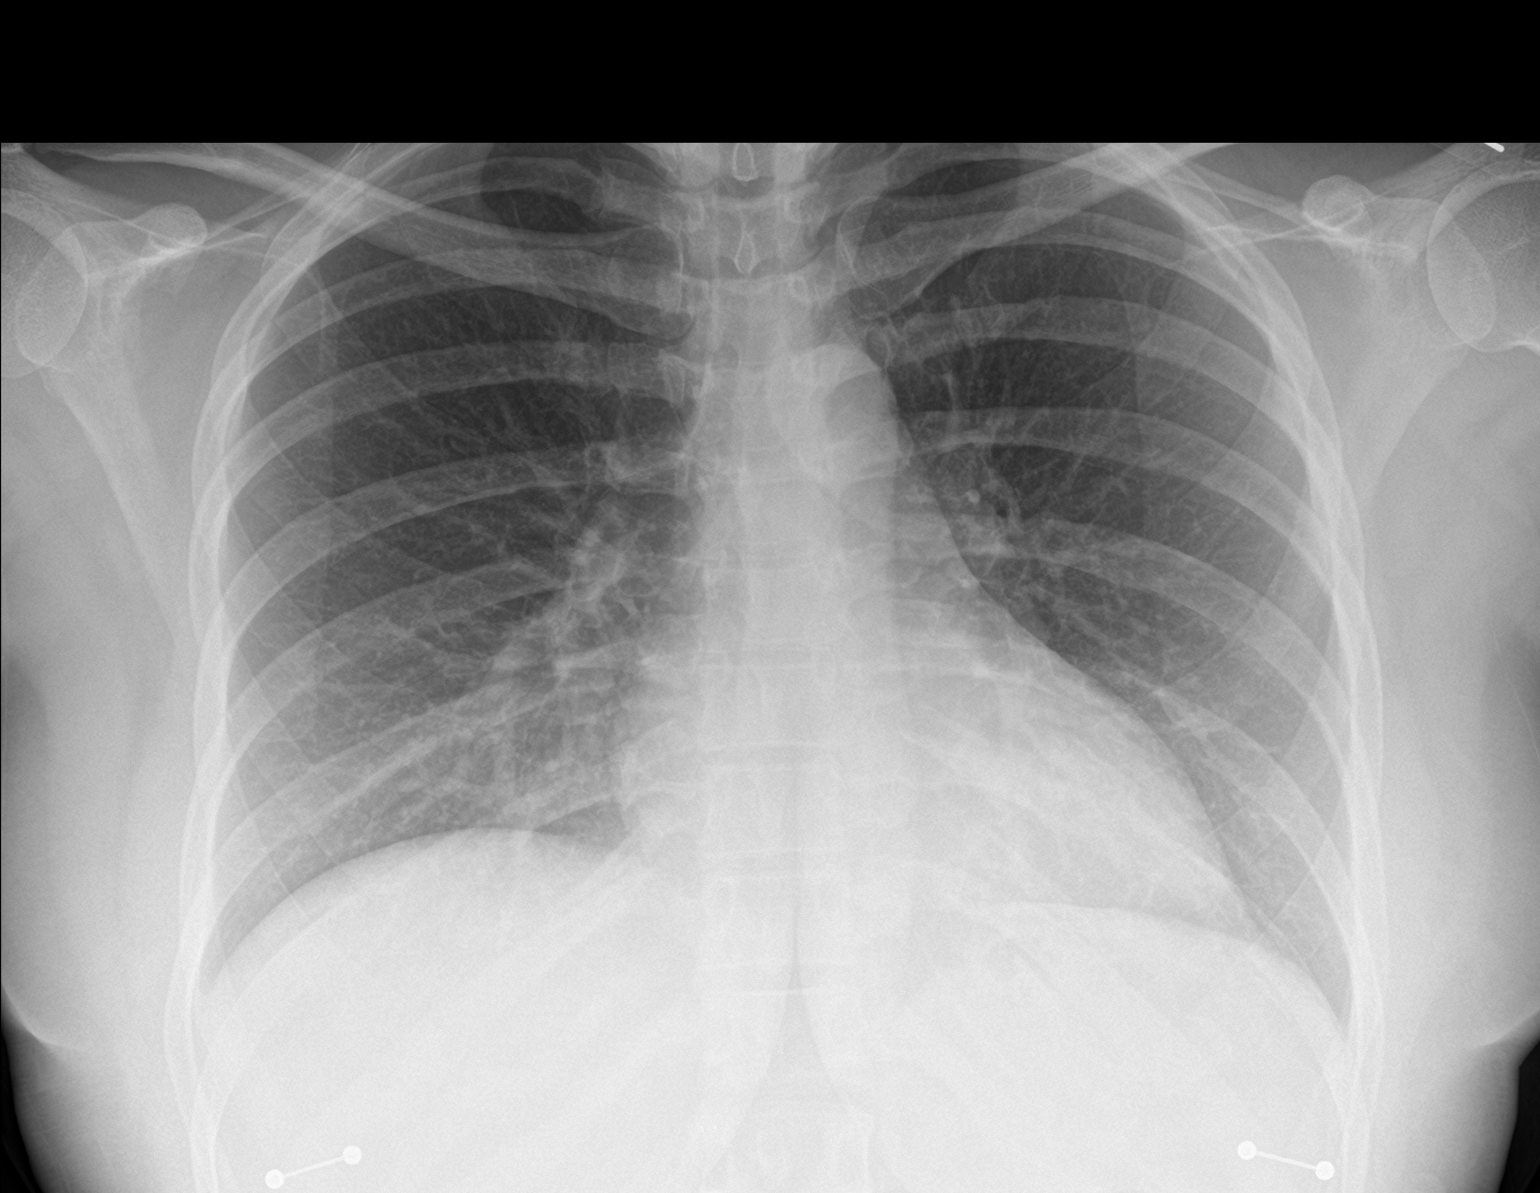

[chest lat]
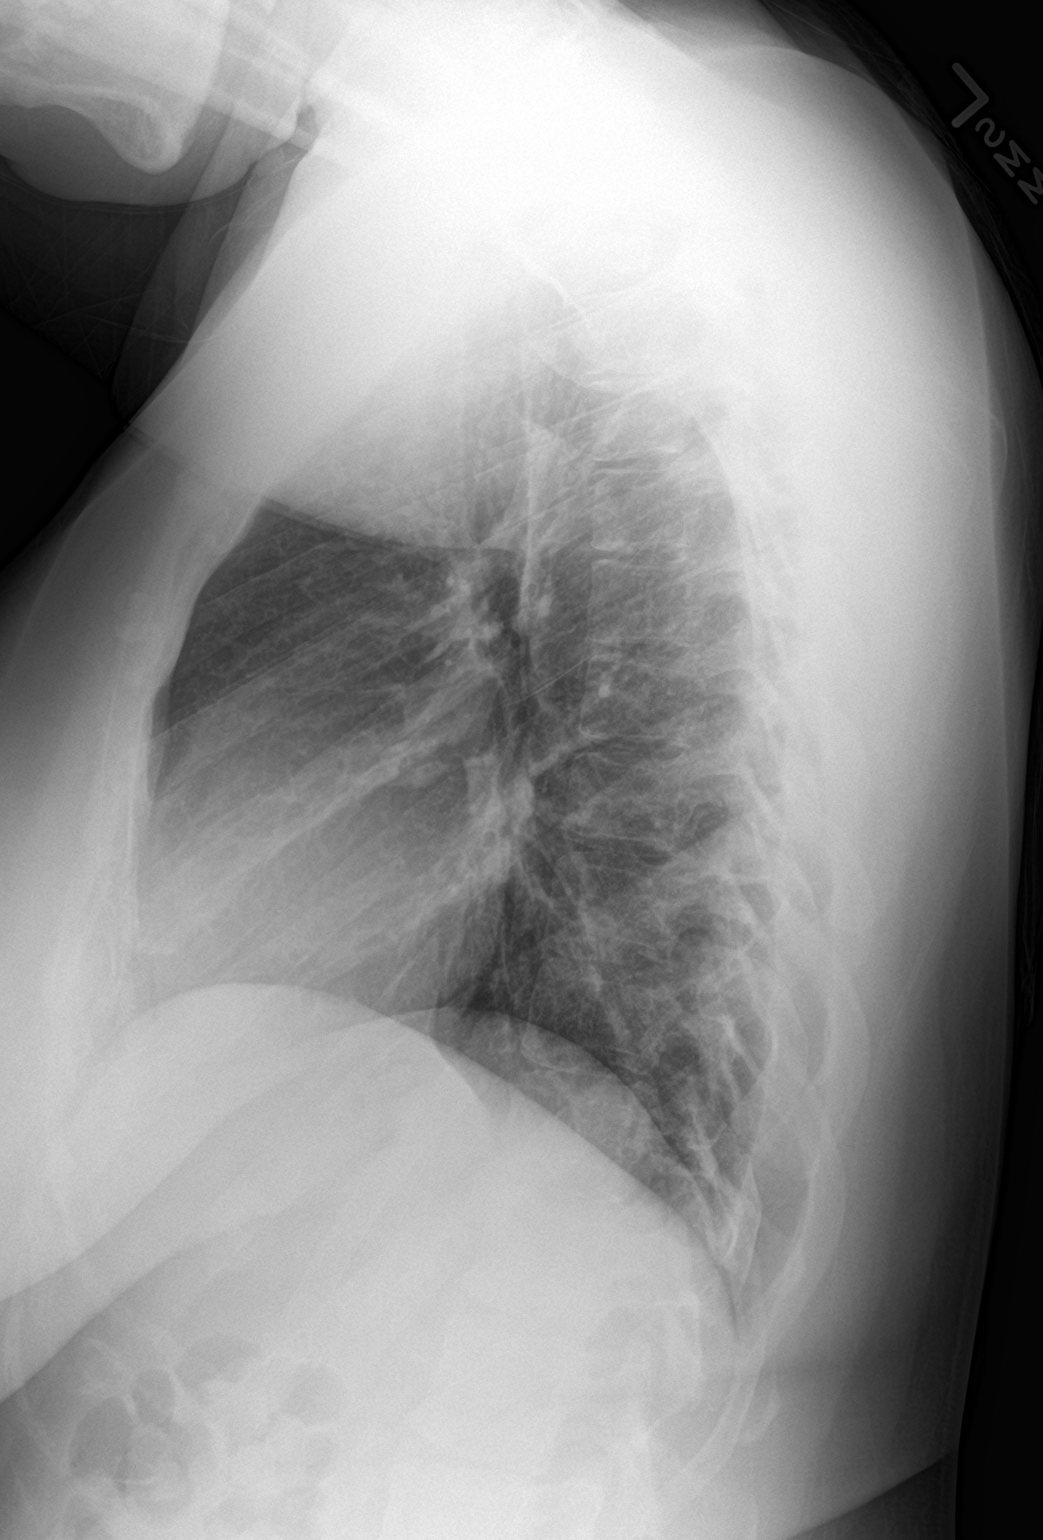

[2 of 2 positions shown; findings below may reference images not displayed]

FINDINGS: The heart size and mediastinal contours are within normal limits.
Both lungs are clear. The visualized skeletal structures are
unremarkable.
IMPRESSION: No active cardiopulmonary disease.

## 2020-12-27 IMAGING — DX DG CHEST 2V
2 series · 2 of 2 positions shown · non-contrast
Comparison: August 10, 2019.

CLINICAL DATA: Chest pain.

EXAM:
CHEST - 2 VIEW

[w chest pa]
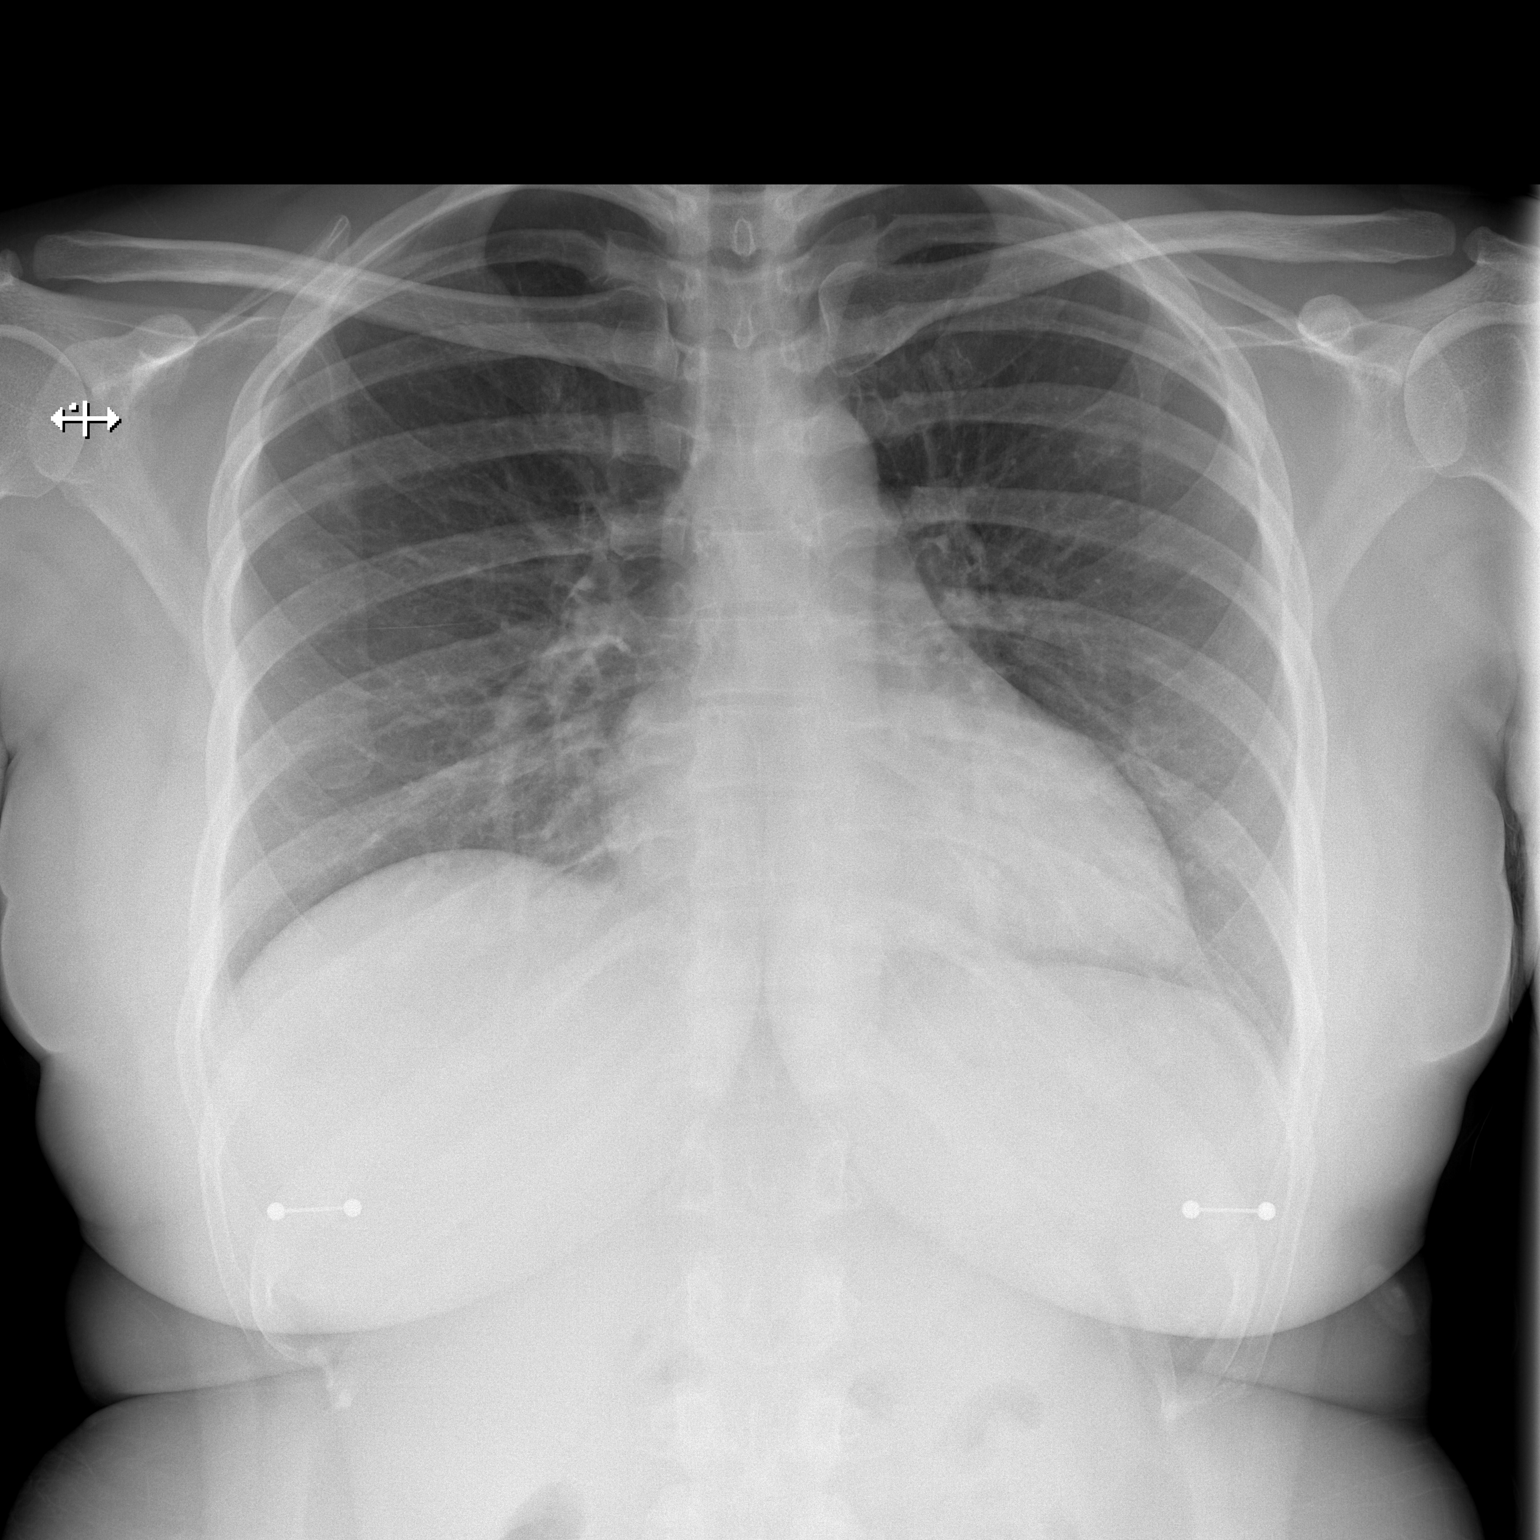

[w chest lat]
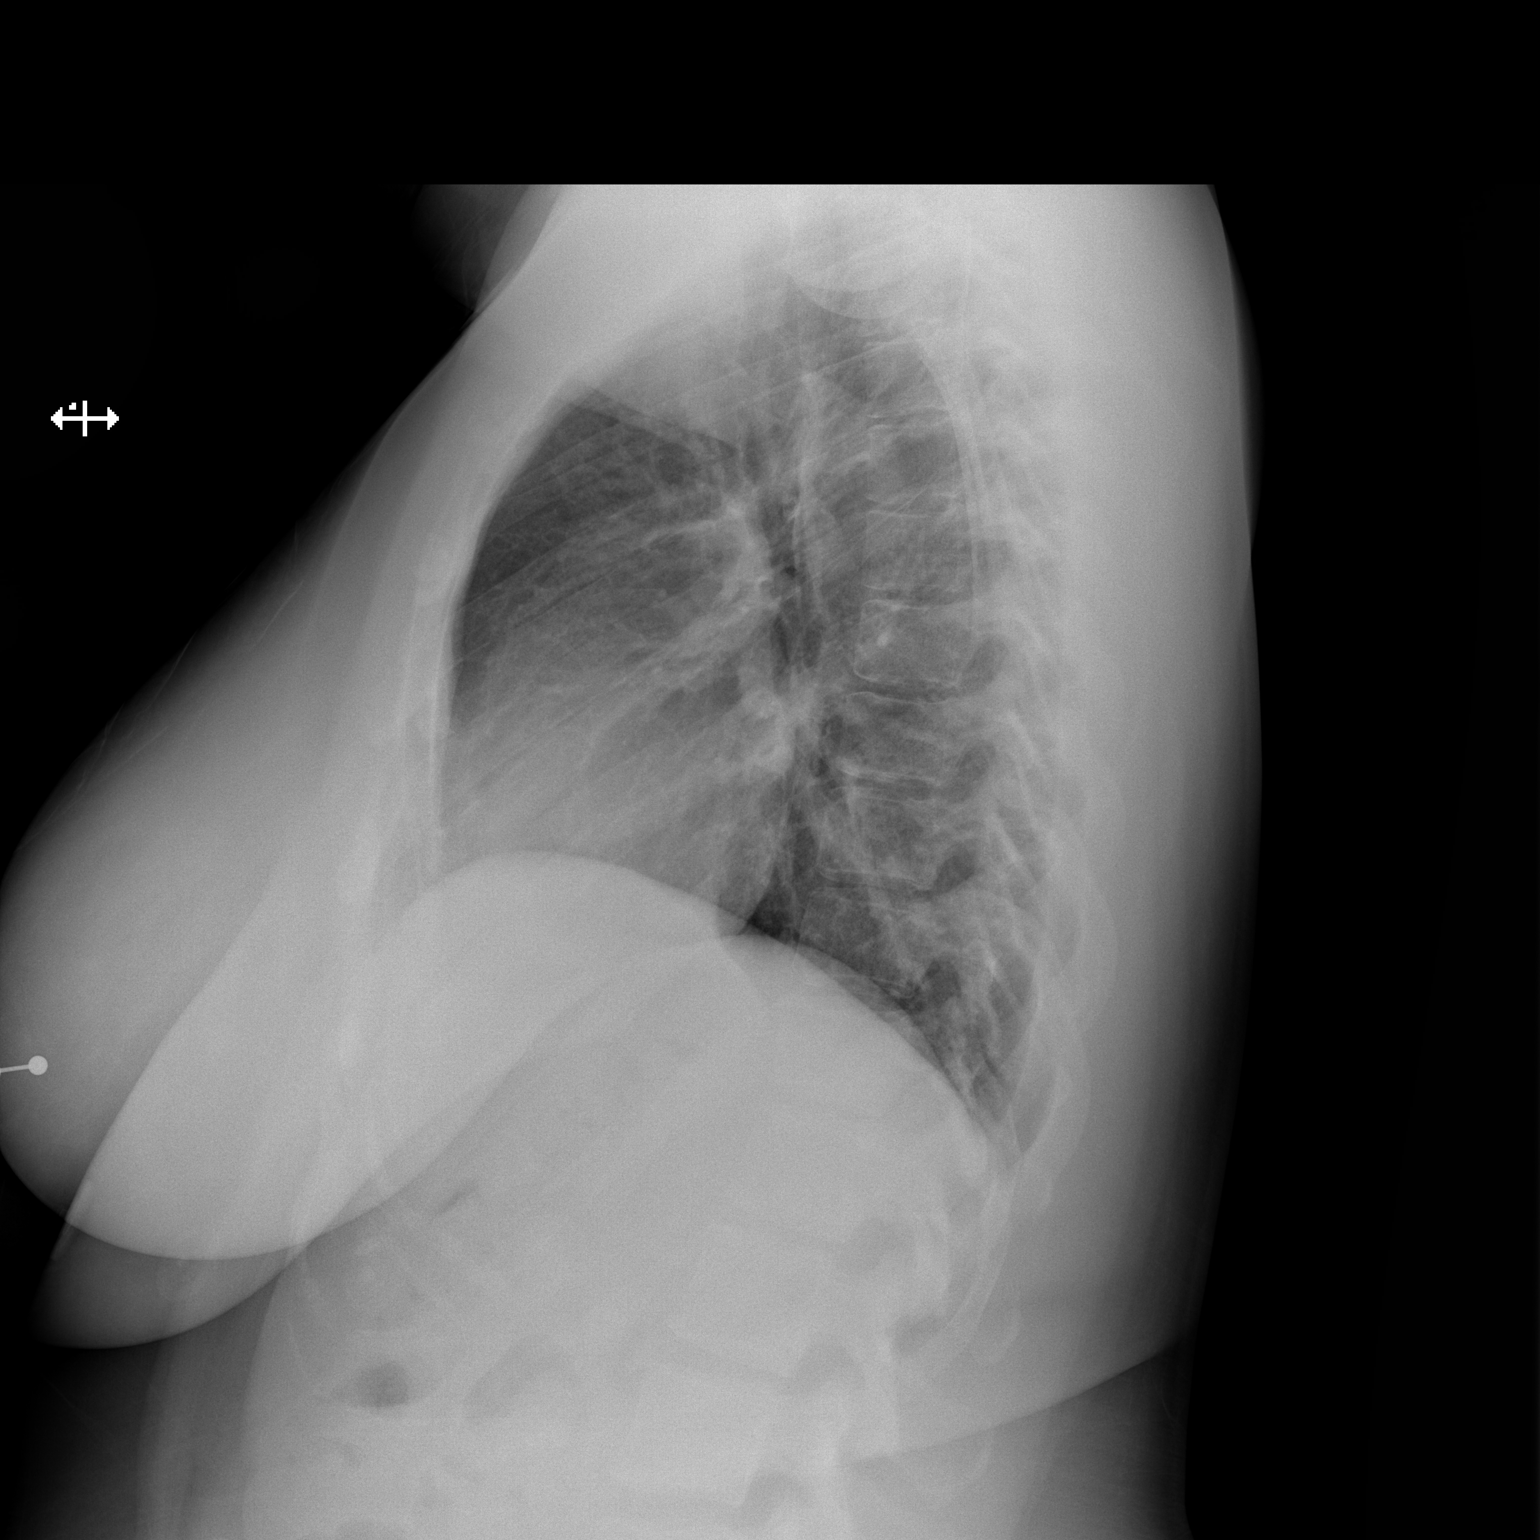

[2 of 2 positions shown; findings below may reference images not displayed]

FINDINGS: The heart size and mediastinal contours are within normal limits.
Both lungs are clear. No pneumothorax or pleural effusion is noted.
The visualized skeletal structures are unremarkable.
IMPRESSION: No active cardiopulmonary disease.

## 2020-12-28 ENCOUNTER — Telehealth: Payer: Self-pay | Admitting: Physician Assistant

## 2021-01-03 NOTE — Progress Notes (Signed)
Virtual Visit via Telephone Note   This visit type was conducted due to national recommendations for restrictions regarding the COVID-19 Pandemic (e.g. social distancing) in an effort to limit this patient's exposure and mitigate transmission in our community.  Due to her co-morbid illnesses, this patient is at least at moderate risk for complications without adequate follow up.  This format is felt to be most appropriate for this patient at this time.  The patient did not have access to video technology/had technical difficulties with video requiring transitioning to audio format only (telephone).  All issues noted in this document were discussed and addressed.  No physical exam could be performed with this format.  Please refer to the patient's chart for her  consent to telehealth for [facility].  Evaluation Performed:  Follow-up visit  This visit type was conducted due to national recommendations for restrictions regarding the COVID-19 Pandemic (e.g. social distancing).  This format is felt to be most appropriate for this patient at this time.  All issues noted in this document were discussed and addressed.  No physical exam was performed (except for noted visual exam findings with Video Visits).  Please refer to the patient's chart (MyChart message for video visits and phone note for telephone visits) for the patient's consent to telehealth for [facility]  Date:  01/04/2021   ID:  Robin Humphrey, DOB 01-01-1986, MRN [id number]  Patient Location:  [address] [place] [address]   Provider location:     [facility] [address] Office [phone] Fax [phone]   PCP:  Default, Provider, MD  Cardiologist:  Chrystie Nose, MD  Electrophysiologist:  None   Chief Complaint: Follow-up for pericarditis  History of Present Illness:    Robin Humphrey is a 35 y.o. female who presents via audio/video conferencing for a  telehealth visit today.  Patient verified DOB and address.  Her past medical history includes idiopathic pericarditis, palpitations, and ovarian cyst.  She was initially seen and evaluated in the emergency department.  She followed up with Dr. Rennis Golden 08/20/2019.  She reported atypical chest pain.  While in the emergency department she was ruled out with negative troponins and nonischemic EKG.  Her D-dimer at that time was also negative.  She was started on naproxen 500 twice daily which helped her chest pain.  Echocardiogram 09/02/2019 showed normal EF no valvular abnormalities, and no pericardial effusion.  She presented back to the emergency room with chest pain 08/29/2019.  She was again ruled out for coronary disease.  Her BMP and lipase at that time were normal.  It was suspected her discomfort was related to anxiety and she was continued on her benzodiazepine.  She was also referred to pulmonology.  She was seen in the clinic on 10/01/2019.  During that time she described palpitations that coincided with her chest discomfort.  She also reported rapid heart rate that would wake her up at night.  She was placed on a 14-day cardiac event monitor which showed sinus rhythm with a single 6 beat run of SVT and no ectopy.  TSH was normal.  CRP and sed rate were mildly elevated.  Case was discussed with Dr. Rennis Golden.  She was placed on colchicine and ibuprofen for suspected pericarditis and she reported relief via phone call 09/17/2019.  She was seen in the clinic on 11/16/2020.  During that time she reported chest discomfort 2-3 days during the prior week.  Her pain had resolved for 2 months after finishing her colchicine.  She reported her chest pain was intermittent and at times she noted pain that would radiate down her left arm which was associated with shortness of breath.  Her chest pain and shortness of breath would resolve with rest in bed.  Her chest pain was located in her right chest wall similar to her prior  pericarditis.  Her sedimentation rate and CRP were redrawn.  Her CRP was elevated at 13 and her sedimentation rate was normal.  She was restarted on ibuprofen 3 times daily and colchicine.  She reported she was still taking her Protonix.  She is contacted virtually today for follow-up and states she has had one episode of chest discomfort that was associated with palpitations since her previous visit.  She reported it was brief and dissipated with rest.    She feels that her palpitations are much better controlled with her metoprolol.  We reviewed triggers for palpitations.  I will give her heart healthy diet information, triggers for palpitations, continue her current medications, and have her follow-up in 6 months.  Today she denies chest pain, shortness of breath, lower extremity edema, fatigue, palpitations, melena, hematuria, hemoptysis, diaphoresis, weakness, presyncope, syncope, orthopnea, and PND.   The patient does not symptoms concerning for COVID-19 infection (fever, chills, cough, or new SHORTNESS OF BREATH).    Prior CV studies:   The following studies were reviewed today:  EKG 11/16/2020 Normal sinus rhythm 66 bpm  Echocardiogram 09/02/2019  IMPRESSIONS    1. Left ventricular ejection fraction, by visual estimation, is 60 to  65%. The left ventricle has normal function. There is no left ventricular  hypertrophy.  2. Global right ventricle has normal systolic function.The right  ventricular size is normal. No increase in right ventricular wall  thickness.  3. Left atrial size was normal.  4. Right atrial size was normal.  5. The mitral valve is normal in structure. Trace mitral valve  regurgitation. No evidence of mitral stenosis.  6. The tricuspid valve is normal in structure. Tricuspid valve  regurgitation is not demonstrated.  7. The aortic valve is normal in structure. Aortic valve regurgitation is  not visualized. No evidence of aortic valve sclerosis or  stenosis.  8. The pulmonic valve was normal in structure. Pulmonic valve  regurgitation is not visualized.  9. The atrial septum is grossly normal.  10. The average left ventricular global longitudinal strain is -21.3 %.  Past Medical History:  Diagnosis Date  . Ovarian cyst    Past Surgical History:  Procedure Laterality Date  . CESAREAN SECTION       Current Meds  Medication Sig  . metoprolol succinate (TOPROL XL) 25 MG 24 hr tablet Take 1 tablet (25 mg total) by mouth at bedtime.  . norelgestromin-ethinyl estradiol (ORTHO EVRA) 150-35 MCG/24HR transdermal patch Place 1 patch onto the skin once a week. Use patch once a week for three weeks; leave patch off for 1 week and begin cycle again.  . pantoprazole (PROTONIX) 40 MG tablet Take 1 tablet (40 mg total) by mouth daily. Take while taking ibuprofen.     Allergies:   Patient has no known allergies.   Social History   Tobacco Use  . Smoking status: Never Smoker  . Smokeless tobacco: Never Used  Vaping Use  . Vaping Use: Never used  Substance Use Topics  . Alcohol use: Yes    Comment: occasionally  . Drug use: Not Currently  Family Hx: The patient's family history includes Asthma in her brother and sister; CAD in an other family member; Diabetes in her mother; Heart disease in her maternal aunt and maternal grandmother; Stroke in her maternal grandmother.  ROS:   Please see the history of present illness.     All other systems reviewed and are negative.   Labs/Other Tests and Data Reviewed:    Recent Labs: 11/16/2020: Hemoglobin 12.5; Platelets 344; TSH 1.230   Recent Lipid Panel No results found for: CHOL, TRIG, HDL, CHOLHDL, LDLCALC, LDLDIRECT  Wt Readings from Last 3 Encounters:  11/16/20 220 lb (99.8 kg)  11/27/19 214 lb 14.4 oz (97.5 kg)  11/12/19 215 lb 9.6 oz (97.8 kg)     Exam:    Vital Signs:  There were no vitals taken for this visit.   Well nourished, well developed female in no  acute  distress.   ASSESSMENT & PLAN:    1.  Idiopathic pericarditis-no chest pain today.  Repeat CRP elevated, sedimentation rate normal.  Previously completed 3 months of colchicine therapy.  Had 2 months of relief, then presented back with similar symptoms.  At that time she was restarted on colchicine and ibuprofen.  She notes only one episode of chest discomfort that was brief since her last appointment. Continue to monitor. Heart healthy diet Increase physical activity as tolerated  Palpitations-no recent episodes of increased heart rate or irregular heartbeats.  Cardiac event monitor showed one 6 beat run of SVT. Continue metoprolol-may take extra metoprolol if needed for sustained palpitations. Avoid triggers caffeine, chocolate, EtOH, dehydration etc.  Patient Risk:   After full review of this patients clinical status, I feel that they are at least moderate risk at this time.  Time:   Today, I have spent 11 minutes with the patient with telehealth technology discussing palpitations, chest pain, medications, triggers.     Medication Adjustments/Labs and Tests Ordered: Current medicines are reviewed at length with the patient today.  Concerns regarding medicines are outlined above.   Tests Ordered: No orders of the defined types were placed in this encounter.  Medication Changes: No orders of the defined types were placed in this encounter.   Disposition:  in 6 month(s)  Signed, Thomasene Ripple. Camarie Mctigue NP-C    05/06/2019 11:58 AM    [facility] [address] Office [phone] Fax [phone]

## 2021-01-04 ENCOUNTER — Telehealth (INDEPENDENT_AMBULATORY_CARE_PROVIDER_SITE_OTHER): Payer: Self-pay | Admitting: General Practice

## 2021-01-04 ENCOUNTER — Encounter: Payer: Self-pay | Admitting: General Practice

## 2021-01-04 DIAGNOSIS — R002 Palpitations: Secondary | ICD-10-CM

## 2021-01-04 DIAGNOSIS — I300 Acute nonspecific idiopathic pericarditis: Secondary | ICD-10-CM

## 2021-01-04 NOTE — Patient Instructions (Addendum)
Medication Instructions:  The current medical regimen is effective;  continue present plan and medications as directed. Please refer to the Current Medication list given to you today.  *If you need a refill on your cardiac medications before your next appointment, please call your pharmacy*  Lab Work:   Testing/Procedures:  NONE    NONE  Special Instructions Please try to avoid these triggers:  Do not use any products that have nicotine or tobacco in them. These include cigarettes, e-cigarettes, and chewing tobacco. If you need help quitting, ask your doctor.  Eat heart-healthy foods. Talk with your doctor about the right eating plan for you.  Exercise regularly as told by your doctor.  Stay hydrated  Do not drink alcohol, Caffeine or chocolate.  Lose weight if you are overweight.  Do not use drugs, including cannabis   MAKE SURE YOU STAY HYDRATED  PLEASE READ AND FOLLOW HEART HEALTHY DIET ATTACHED  Follow-Up: Your next appointment:  6 month(s) In Person with K. [place] Hilty, MD   PLEASE CALL OUR OFFICE IN AUGUST TO SCHEDULE THIS APPOINTMENT  At [facility], you and your health needs are our priority.  As part of our continuing mission to provide you with exceptional heart care, we have created designated Provider Care Teams.  These Care Teams include your primary Cardiologist (physician) and Advanced Practice Providers (APPs -  Physician Assistants and Nurse Practitioners) who all work together to provide you with the care you need, when you need it.    Heart-Healthy Eating Plan Heart-healthy meal planning includes:  Eating less unhealthy fats.  Eating more healthy fats.  Making other changes in your diet. Talk with your doctor or a diet specialist (dietitian) to create an eating plan that is right for you. What is my plan?  What are tips for following this plan? Cooking Avoid frying your food. Try to bake, boil, grill, or broil it instead. You can also reduce fat  by:  Removing the skin from poultry.  Removing all visible fats from meats.  Steaming vegetables in water or broth. Meal planning  At meals, divide your plate into four equal parts: ? Fill one-half of your plate with vegetables and green salads. ? Fill one-fourth of your plate with whole grains. ? Fill one-fourth of your plate with lean protein foods.  Eat 4-5 servings of vegetables per day. A serving of vegetables is: ? 1 cup of raw or cooked vegetables. ? 2 cups of raw leafy greens.  Eat 4-5 servings of fruit per day. A serving of fruit is: ? 1 medium whole fruit. ?  cup of dried fruit. ?  cup of fresh, frozen, or canned fruit. ?  cup of 100% fruit juice.  Eat more foods that have soluble fiber. These are apples, broccoli, carrots, beans, peas, and barley. Try to get 20-30 g of fiber per day.  Eat 4-5 servings of nuts, legumes, and seeds per week: ? 1 serving of dried beans or legumes equals  cup after being cooked. ? 1 serving of nuts is  cup. ? 1 serving of seeds equals 1 tablespoon.   General information  Eat more home-cooked food. Eat less restaurant, buffet, and fast food.  Limit or avoid alcohol.  Limit foods that are high in starch and sugar.  Avoid fried foods.  Lose weight if you are overweight.  Keep track of how much salt (sodium) you eat. This is important if you have high blood pressure. Ask your doctor to tell you more  about this.  Try to add vegetarian meals each week. Fats  Choose healthy fats. These include olive oil and canola oil, flaxseeds, walnuts, almonds, and seeds.  Eat more omega-3 fats. These include salmon, mackerel, sardines, tuna, flaxseed oil, and ground flaxseeds. Try to eat fish at least 2 times each week.  Check food labels. Avoid foods with trans fats or high amounts of saturated fat.  Limit saturated fats. ? These are often found in animal products, such as meats, butter, and cream. ? These are also found in plant foods,  such as palm oil, palm kernel oil, and coconut oil.  Avoid foods with partially hydrogenated oils in them. These have trans fats. Examples are stick margarine, some tub margarines, cookies, crackers, and other baked goods. What foods can I eat? Fruits All fresh, canned (in natural juice), or frozen fruits. Vegetables Fresh or frozen vegetables (raw, steamed, roasted, or grilled). Green salads. Grains Most grains. Choose whole wheat and whole grains most of the time. Rice and pasta, including brown rice and pastas made with whole wheat. Meats and other proteins Lean, well-trimmed beef, veal, pork, and lamb. Chicken and [place] without skin. All fish and shellfish. Wild duck, rabbit, pheasant, and venison. Egg whites or low-cholesterol egg substitutes. Dried beans, peas, lentils, and tofu. Seeds and most nuts. Dairy Low-fat or nonfat cheeses, including ricotta and mozzarella. Skim or 1% milk that is liquid, powdered, or evaporated. Buttermilk that is made with low-fat milk. Nonfat or low-fat yogurt. Fats and oils Non-hydrogenated (trans-free) margarines. Vegetable oils, including soybean, sesame, sunflower, olive, peanut, safflower, corn, canola, and cottonseed. Salad dressings or mayonnaise made with a vegetable oil. Beverages Mineral water. Coffee and tea. Diet carbonated beverages. Sweets and desserts Sherbet, gelatin, and fruit ice. Small amounts of dark chocolate. Limit all sweets and desserts. Seasonings and condiments All seasonings and condiments. The items listed above may not be a complete list of foods and drinks you can eat. Contact a dietitian for more options. What foods should I avoid? Fruits Canned fruit in heavy syrup. Fruit in cream or butter sauce. Fried fruit. Limit coconut. Vegetables Vegetables cooked in cheese, cream, or butter sauce. Fried vegetables. Grains Breads that are made with saturated or trans fats, oils, or whole milk. Croissants. Sweet rolls. Donuts.  High-fat crackers, such as cheese crackers. Meats and other proteins Fatty meats, such as hot dogs, ribs, sausage, bacon, rib-eye roast or steak. High-fat deli meats, such as salami and bologna. Caviar. Domestic duck and goose. Organ meats, such as liver. Dairy Cream, sour cream, cream cheese, and creamed cottage cheese. Whole-milk cheeses. Whole or 2% milk that is liquid, evaporated, or condensed. Whole buttermilk. Cream sauce or high-fat cheese sauce. Yogurt that is made from whole milk. Fats and oils Meat fat, or shortening. Cocoa butter, hydrogenated oils, palm oil, coconut oil, palm kernel oil. Solid fats and shortenings, including bacon fat, salt pork, lard, and butter. Nondairy cream substitutes. Salad dressings with cheese or sour cream. Beverages Regular sodas and juice drinks with added sugar. Sweets and desserts Frosting. Pudding. Cookies. Cakes. Pies. Milk chocolate or white chocolate. Buttered syrups. Full-fat ice cream or ice cream drinks. The items listed above may not be a complete list of foods and drinks to avoid. Contact a dietitian for more information. Summary  Heart-healthy meal planning includes eating less unhealthy fats, eating more healthy fats, and making other changes in your diet.  Eat a balanced diet. This includes fruits and vegetables, low-fat or nonfat dairy, lean protein, nuts  and legumes, whole grains, and heart-healthy oils and fats. This information is not intended to replace advice given to you by your health care provider. Make sure you discuss any questions you have with your health care provider. Document Revised: 11/22/2017 Document Reviewed: 10/26/2017 Elsevier Patient Education  2021 [employer].

## 2021-01-06 ENCOUNTER — Other Ambulatory Visit: Payer: Self-pay

## 2021-01-06 DIAGNOSIS — Z3045 Encounter for surveillance of transdermal patch hormonal contraceptive device: Secondary | ICD-10-CM

## 2021-01-06 MED ORDER — NORELGESTROMIN-ETH ESTRADIOL 150-35 MCG/24HR TD PTWK: 1.0000 | MEDICATED_PATCH | TRANSDERMAL | 0 refills | Status: DC

## 2021-01-07 IMAGING — DX DG CHEST 1V PORT
1 series · 1 of 1 positions shown · non-contrast
Comparison: August 18, 2019

CLINICAL DATA: Chest pain and shortness of breath

EXAM:
PORTABLE CHEST 1 VIEW

[chest ap]
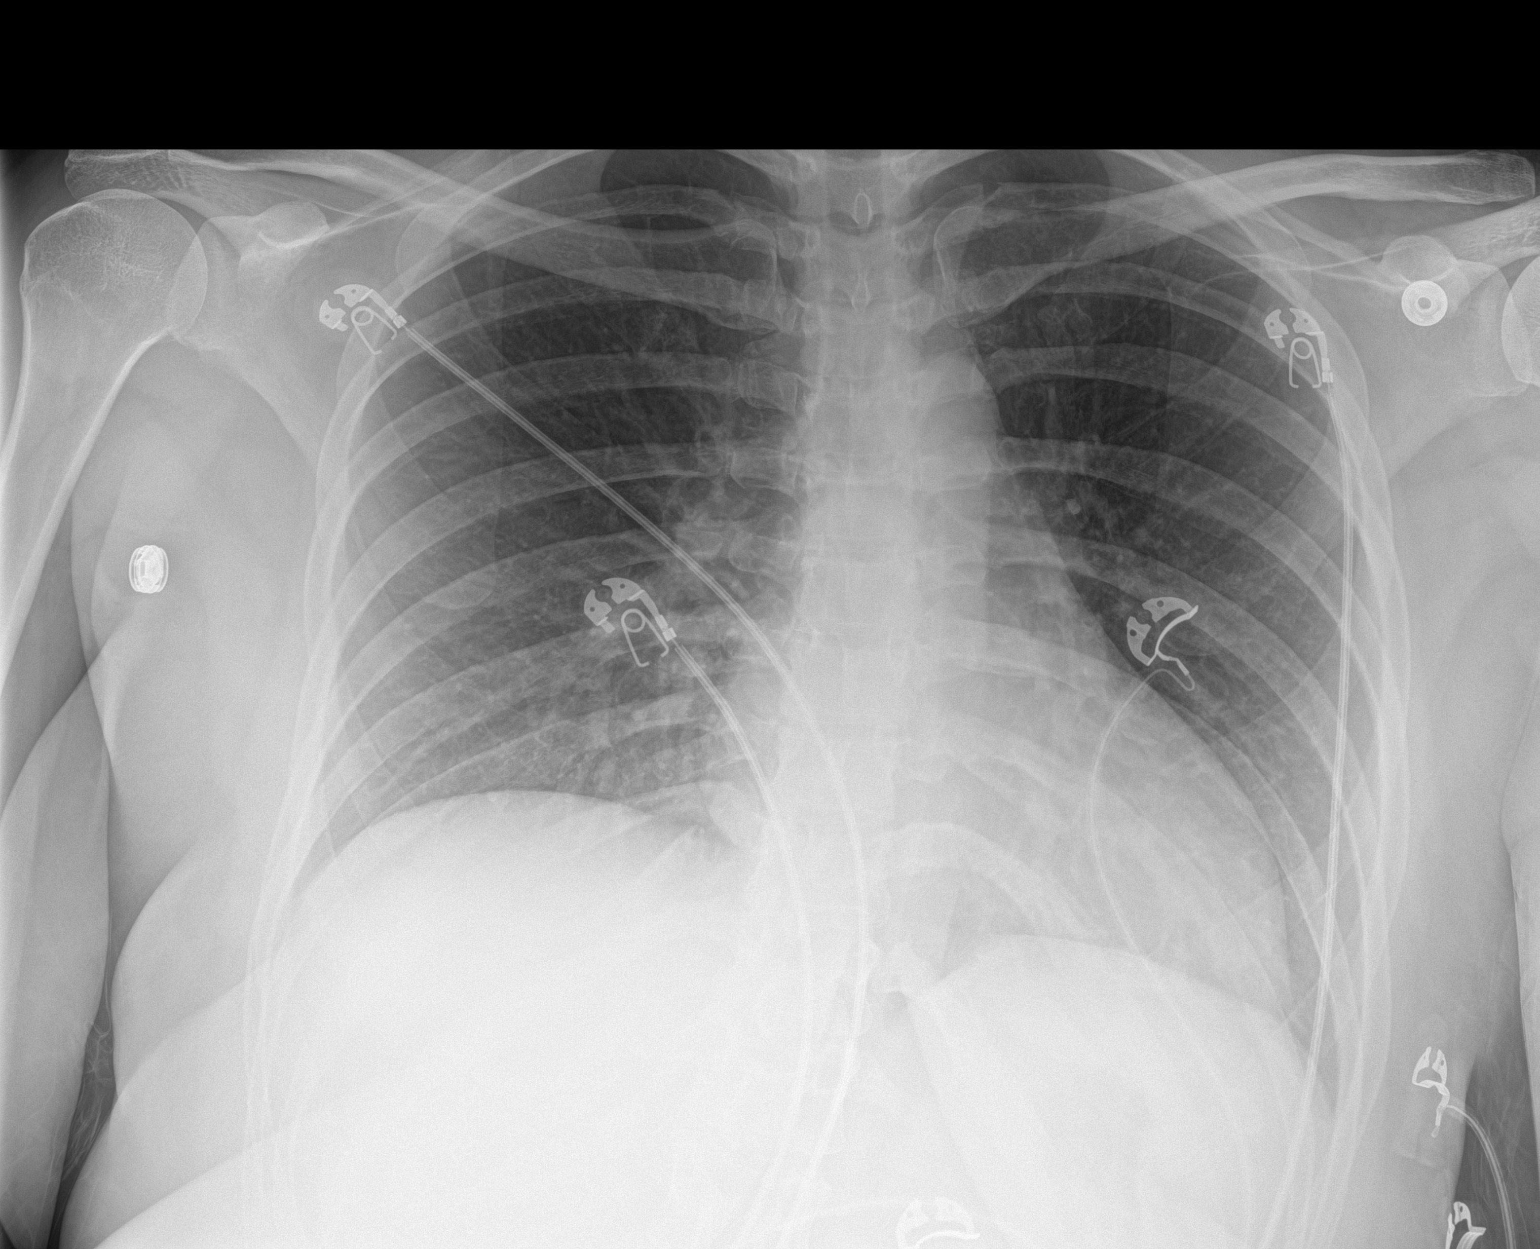

[1 of 1 positions shown; findings below may reference images not displayed]

FINDINGS: Lungs are clear. Heart is upper normal in size with pulmonary
vascularity normal. No adenopathy. No pneumothorax. No bone lesions.
IMPRESSION: No edema or consolidation. Heart upper normal in size. No adenopathy
evident.

## 2021-01-21 ENCOUNTER — Other Ambulatory Visit: Payer: Self-pay

## 2021-01-21 ENCOUNTER — Ambulatory Visit (INDEPENDENT_AMBULATORY_CARE_PROVIDER_SITE_OTHER): Payer: Medicaid Other | Admitting: Obstetrics and Gynecology

## 2021-01-21 ENCOUNTER — Other Ambulatory Visit (HOSPITAL_COMMUNITY)
Admission: RE | Admit: 2021-01-21 | Discharge: 2021-01-21 | Disposition: A | Discharge status: Home / Self Care | Payer: Medicaid Other | Source: Ambulatory Visit | Attending: Obstetrics and Gynecology | Admitting: Obstetrics and Gynecology

## 2021-01-21 ENCOUNTER — Encounter: Payer: Self-pay | Admitting: Obstetrics and Gynecology

## 2021-01-21 VITALS — BP 121/80 | HR 78 | Ht 67.0 in | Wt 214.6 lb

## 2021-01-21 DIAGNOSIS — Z113 Encounter for screening for infections with a predominantly sexual mode of transmission: Secondary | ICD-10-CM

## 2021-01-21 DIAGNOSIS — Z30430 Encounter for insertion of intrauterine contraceptive device: Secondary | ICD-10-CM

## 2021-01-21 DIAGNOSIS — Z01419 Encounter for gynecological examination (general) (routine) without abnormal findings: Secondary | ICD-10-CM

## 2021-01-21 DIAGNOSIS — Z3009 Encounter for other general counseling and advice on contraception: Secondary | ICD-10-CM

## 2021-01-21 MED ORDER — PARAGARD INTRAUTERINE COPPER IU IUD: INTRAUTERINE_SYSTEM | Freq: Once | INTRAUTERINE | Status: AC

## 2021-01-21 NOTE — Procedures (Signed)
Intrauterine Device (IUD) Insertion Procedure Note  After a recent pap (results: negative/date: 2021) and recent gonorrhea-chlamydia testing (date: done day of insertion) was confirmed, written consent was obtained. The patient understands the risks of IUD placement, which include but are not limited to: bleeding, infection, uterine perforation, risk of expulsion, risk of failure < 1%, increased risk of ectopic pregnancy in the event of failure.   Prior to the procedure being performed, the patient (or guardian) was asked to state their full name, date of birth, and the type of procedure being performed. A bimanual exam showed the uterus to be midposition.  Next, the cervix and vagina were cleaned with an antiseptic solution, and the cervix was grasped with a tenaculum and the cervical canal dilated with os finders.  The uterus was sounded to 8 cm.  The ParaGard was placed without difficulty in the usual fashion.  The strings were cut to 3-4 cm.  The tenaculum was removed and cervix was found to be hemostatic.    No complications, patient tolerated the procedure well.  Cornelia Copa MD Attending Center for [employer] [facility])

## 2021-01-21 NOTE — Progress Notes (Signed)
Obstetrics and Gynecology New Patient Evaluation  Appointment Date: 01/21/2021  OBGYN Clinic: [facility]  Primary Care Provider: Default, Provider  Referring Provider: No ref. provider found  Chief Complaint:  Chief Complaint  Patient presents with  . Gynecologic Exam    History of Present Illness: Robin Humphrey is a 35 y.o. African-American [id number] (Patient's last menstrual period was 12/21/2020.), seen for the above chief complaint. Her past medical history is significant for h/o ovarian cysts.   Patient is interested in other birth control methods b/c the patch causes skin irritation even with rotating patch sites. She's used OCPs, depo and the ring before. The ring kept falling out and she thought the depo made her gain weight. She would like a method where she keeps getting a period.   Review of Systems: Pertinent items noted in HPI and remainder of comprehensive ROS otherwise negative.   Patient Active Problem List   Diagnosis Date Noted  . 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 Palpitations 11/16/2020  . PSVT (paroxysmal supraventricular tachycardia) (HCC) 11/16/2020  . Left ovarian cyst 11/13/2018   Past Medical History:  Past Medical History:  Diagnosis Date  . Ovarian cyst     Past Surgical History:  Past Surgical History:  Procedure Laterality Date  . CESAREAN SECTION      Past Obstetrical History:  OB History  Gravida Para Term Preterm AB Living  4       2 2   SAB IAB Ectopic Multiple Live Births  1 1     2     # Outcome Date GA Lbr Len/2nd Weight Sex Delivery Anes PTL Lv  4 Gravida 2010     CS-Unspec     3 Gravida 2009     CS-Unspec     2 IAB           1 SAB             Past Gynecological History: As per HPI. Periods: qmonth, regular, not heavy or painful.  History of Pap Smear(s): Yes.   Last pap 11/2019, which was negative  Social History:  Social History   Socioeconomic History  . Marital status: Single     Spouse name: Not on file  . Number of children: Not on file  . Years of education: Not on file  . Highest education level: Not on file  Occupational History  . Not on file  Tobacco Use  . Smoking status: Never Smoker  . Smokeless tobacco: Never Used  Vaping Use  . Vaping Use: Never used  Substance and Sexual Activity  . Alcohol use: Yes    Comment: occasionally  . Drug use: Not Currently  . Sexual activity: Yes    Birth control/protection: None  Other Topics Concern  . Not on file  Social History Narrative  . Not on file   Social Determinants of Health   Financial Resource Strain: Not on file  Food Insecurity: Not on file  Transportation Needs: Not on file  Physical Activity: Not on file  Stress: Not on file  Social Connections: Not on file  Intimate Partner Violence: Not on file    Family History:  Family History  Problem Relation Age of Onset  . CAD Other   . Diabetes Mother   . Asthma Sister   . Asthma Brother   . Heart disease Maternal Aunt   . Stroke Maternal Grandmother   . Heart disease Maternal Grandmother     Medications 2010 had  no medications administered during this visit. Current Outpatient Medications  Medication Sig Dispense Refill  . metoprolol succinate (TOPROL XL) 25 MG 24 hr tablet Take 1 tablet (25 mg total) by mouth at bedtime. 30 tablet 3  . norelgestromin-ethinyl estradiol (ORTHO EVRA) 150-35 MCG/24HR transdermal patch Place 1 patch onto the skin once a week. Use patch once a week for three weeks; leave patch off for 1 week and begin cycle again. 2 patch 0  . pantoprazole (PROTONIX) 40 MG tablet Take 1 tablet (40 mg total) by mouth daily. Take while taking ibuprofen. 90 tablet 3  . colchicine 0.6 MG tablet Take 1 tablet (0.6 mg total) by mouth daily for 14 days. 14 tablet 0   No current facility-administered medications for this visit.    Allergies Patient has no known allergies.   Physical Exam:  BP 121/80   Pulse 78    Ht 5\' 7"  (1.702 m)   Wt 214 lb 9.6 oz (97.3 kg)   LMP 12/21/2020 Comment: BC Patch  BMI 33.61 kg/m  Body mass index is 33.61 kg/m. General appearance: Well nourished, well developed female in no acute distress.  Neck:  Supple, normal appearance, and no thyromegaly  Cardiovascular: normal s1 and s2.  No murmurs, rubs or gallops. Respiratory:  Clear to auscultation bilateral. Normal respiratory effort Abdomen: positive bowel sounds and no masses, hernias; diffusely non tender to palpation, non distended Breasts: breasts appear normal, no suspicious masses, no skin or nipple changes or axillary nodes, and normal palpation. Neuro/Psych:  Normal mood and affect.  Skin:  Warm and dry.  Lymphatic:  No inguinal lymphadenopathy.   Pelvic exam: is not limited by body habitus EGBUS: within normal limits Vagina: within normal limits and with no blood or discharge in the vault Cervix: normal appearing cervix without tenderness, discharge or lesions. Uterus:  nonenlarged and non tender Adnexa:  normal adnexa and no mass, fullness, tenderness Rectovaginal: deferred  See procedure note for Paragard IUD insertion  Laboratory: None  Radiology: None  Assessment: pt stable.   Plan: patient doing well 1. Well woman exam with routine gynecological exam Routine care. Patient desired STD screening today. Pap smear UTD  2. Encounter for other general counseling or advice on contraception Options d/w her. She desired IUD. I did tell her that IUD may increase risk of ovarian cysts and she is okay with this risk. Patient told to d/c patch in one week to be on the safe side in terms of birth control bridging  3. Encounter for IUD insertion    RTC 4-6wks for string check.   12/23/2020 MD Attending [facility] [employer])

## 2021-01-22 ENCOUNTER — Encounter: Payer: Self-pay | Admitting: Obstetrics and Gynecology

## 2021-01-22 HISTORY — PX: IUD INSERTION: OBO1003

## 2021-01-22 LAB — HEPATITIS B SURFACE ANTIGEN: Hepatitis B Surface Ag: NEGATIVE

## 2021-01-22 LAB — RPR: RPR Ser Ql: NONREACTIVE

## 2021-01-22 LAB — HIV ANTIBODY (ROUTINE TESTING W REFLEX): HIV Screen 4th Generation wRfx: NONREACTIVE

## 2021-01-24 ENCOUNTER — Encounter: Payer: Self-pay | Admitting: General Practice

## 2021-01-24 LAB — CERVICOVAGINAL ANCILLARY ONLY
Chlamydia: NEGATIVE
Comment: NEGATIVE
Comment: NORMAL
Neisseria Gonorrhea: NEGATIVE

## 2021-01-25 ENCOUNTER — Telehealth: Payer: Self-pay | Admitting: General Practice

## 2021-01-25 DIAGNOSIS — Z3045 Encounter for surveillance of transdermal patch hormonal contraceptive device: Secondary | ICD-10-CM

## 2021-01-25 MED ORDER — NORELGESTROMIN-ETH ESTRADIOL 150-35 MCG/24HR TD PTWK: 1.0000 | MEDICATED_PATCH | TRANSDERMAL | 0 refills | Status: DC

## 2021-01-25 NOTE — Telephone Encounter (Signed)
Called patient regarding mychart message and discussed irregular bleeding after an IUD insertion is normal and usually improves after a couple weeks. Advised if she wants to continue the patches for another month to help with the transition she can- approved by Dr Crissie Reese with refills. Also advised patient if she thinks she has a yeast infection and has used monistat in the past with success she can use that again. Patient verbalized understanding to all.

## 2021-03-04 ENCOUNTER — Other Ambulatory Visit: Payer: Self-pay

## 2021-03-04 ENCOUNTER — Ambulatory Visit (INDEPENDENT_AMBULATORY_CARE_PROVIDER_SITE_OTHER): Payer: Medicaid Other | Admitting: Obstetrics and Gynecology

## 2021-03-04 ENCOUNTER — Encounter: Payer: Self-pay | Admitting: Obstetrics and Gynecology

## 2021-03-04 VITALS — BP 130/86 | HR 70 | Ht 67.0 in | Wt 216.3 lb

## 2021-03-04 DIAGNOSIS — Z30431 Encounter for routine checking of intrauterine contraceptive device: Secondary | ICD-10-CM | POA: Diagnosis not present

## 2021-03-04 NOTE — Progress Notes (Signed)
Obstetrics and Gynecology Visit Return Patient Evaluation  Appointment Date: 03/04/2021  Primary Care Provider: Default, Provider  OBGYN Clinic: [facility]  Chief Complaint: IUD string check   History of Present Illness:  Robin Humphrey is a 35 y.o. s/p paragard IUD on 4/22.   Interval History: Since that time, she states that she had some AUB that resolved and started her period today. Has had intercourse last week and no issues.   Review of Systems:  as noted in the History of Present Illness.  Patient Active Problem List   Diagnosis Date Noted  . 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 Palpitations 11/16/2020  . PSVT (paroxysmal supraventricular tachycardia) (HCC) 11/16/2020  . Left ovarian cyst 11/13/2018   Medications:  Robin Humphrey had no medications administered during this visit. Current Outpatient Medications  Medication Sig Dispense Refill  . metoprolol succinate (TOPROL XL) 25 MG 24 hr tablet Take 1 tablet (25 mg total) by mouth at bedtime. 30 tablet 3  . PARAGARD INTRAUTERINE COPPER IU by Intrauterine route.    . colchicine 0.6 MG tablet Take 1 tablet (0.6 mg total) by mouth daily for 14 days. 14 tablet 0  . norelgestromin-ethinyl estradiol (ORTHO EVRA) 150-35 MCG/24HR transdermal patch Place 1 patch onto the skin once a week. Use patch once a week for three weeks; leave patch off for 1 week and begin cycle again. (Patient not taking: Reported on 03/04/2021) 2 patch 0  . pantoprazole (PROTONIX) 40 MG tablet Take 1 tablet (40 mg total) by mouth daily. Take while taking ibuprofen. 90 tablet 3   No current facility-administered medications for this visit.    Allergies: has No Known Allergies.  Physical Exam:  BP 130/86   Pulse 70   Ht 5\' 7"  (1.702 m)   Wt 216 lb 4.8 oz (98.1 kg)   BMI 33.88 kg/m  Body mass index is 33.88 kg/m. General appearance: Well nourished, well developed female in no acute distress.  Abdomen: diffusely non  tender to palpation, non distended, and no masses, hernias Neuro/Psych:  Normal mood and affect.    Pelvic exam:  EGBUS: within normal limits.  Vagina: Scant old blood in the vault.  Cervix: White IUD strings 3cm  Assessment: pt doing well  Plan:  1. IUD check up No issues   RTC: PRN  MD Attending [facility])

## 2021-06-29 MED ORDER — METOPROLOL SUCCINATE ER 25 MG PO TB24: 25.0000 mg | ORAL_TABLET | Freq: Every day | ORAL | 2 refills | Status: DC

## 2022-04-09 ENCOUNTER — Encounter: Payer: Self-pay | Admitting: Internal Medicine

## 2022-04-10 MED ORDER — METOPROLOL SUCCINATE ER 25 MG PO TB24: 25.0000 mg | ORAL_TABLET | Freq: Every day | ORAL | 0 refills | Status: DC

## 2022-08-06 ENCOUNTER — Encounter (HOSPITAL_COMMUNITY): Payer: Self-pay

## 2022-08-06 ENCOUNTER — Ambulatory Visit (HOSPITAL_COMMUNITY)
Admission: EM | Admit: 2022-08-06 | Discharge: 2022-08-06 | Disposition: A | Discharge status: Home / Self Care | Payer: Commercial Managed Care - HMO | Attending: Family Medicine | Admitting: Family Medicine

## 2022-08-06 ENCOUNTER — Telehealth: Payer: Commercial Managed Care - HMO | Admitting: Family

## 2022-08-06 DIAGNOSIS — Z975 Presence of (intrauterine) contraceptive device: Secondary | ICD-10-CM | POA: Insufficient documentation

## 2022-08-06 DIAGNOSIS — R1032 Left lower quadrant pain: Secondary | ICD-10-CM

## 2022-08-06 DIAGNOSIS — R102 Pelvic and perineal pain: Secondary | ICD-10-CM | POA: Diagnosis present

## 2022-08-06 MED ORDER — KETOROLAC TROMETHAMINE 30 MG/ML IJ SOLN
30.0000 mg | Freq: Once | INTRAMUSCULAR | Status: AC
  Administered 2022-08-06: 30 mg via INTRAMUSCULAR

## 2022-08-06 MED ORDER — NAPROXEN 500 MG PO TABS: 500.0000 mg | ORAL_TABLET | Freq: Two times a day (BID) | ORAL | 0 refills | Status: DC | PRN

## 2022-08-06 MED ORDER — KETOROLAC TROMETHAMINE 30 MG/ML IJ SOLN
INTRAMUSCULAR | Status: AC
  Filled 2022-08-06: qty 1

## 2022-08-06 NOTE — ED Provider Notes (Signed)
[facility]    CSN: [id number] Arrival date & time: 08/06/22  1521      History   Chief Complaint Chief Complaint  Patient presents with   Abdominal Pain    HPI Robin Humphrey is a 36 y.o. female.    Abdominal Pain  Here for a 3 to 4-day history of pelvic pain.  It hurts all across her right and lower abdomen.  It can wax and wane.  Ibuprofen did not really help with the other night.  No nausea or vomiting.  Appetite is good.  No fever or chills.  No dysuria or hematuria.  Last menstrual cycle was October 26.  She states she does not have any new vaginal discharge.  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 Authorizing Provider  naproxen (NAPROSYN) 500 MG tablet Take 1 tablet (500 mg total) by mouth 2 (two) times daily as needed (pain). 08/06/22  Yes Zenia Resides, MD  metoprolol succinate (TOPROL XL) 25 MG 24 hr tablet Take 1 tablet (25 mg total) by mouth at bedtime. 04/10/22   Hilty, Lisette Abu, MD  pantoprazole (PROTONIX) 40 MG tablet Take 1 tablet (40 mg total) by mouth daily. Take while taking ibuprofen. 02/10/20 02/09/21  Hilty, Lisette Abu, MD  PARAGARD INTRAUTERINE COPPER IU by Intrauterine route. 01/21/21   [provider]    Family History Family History  Problem Relation Age of Onset    CAD Other    Diabetes Mother    Asthma Sister    Asthma Brother    Heart disease Maternal Aunt    Stroke Maternal Grandmother    Heart disease Maternal Grandmother     Social History Social History   Tobacco Use   Smoking status: Never   Smokeless tobacco: Never  Vaping Use   Vaping Use: Never used  Substance Use Topics   Alcohol use: Yes    Comment: occasionally   Drug use: Not Currently     Allergies   Patient has no known allergies.   Review of Systems Review of Systems  Gastrointestinal:  Positive for abdominal pain.     Physical Exam Triage Vital Signs ED Triage Vitals  Enc Vitals Group     BP 08/06/22 1644 129/74     Pulse Rate 08/06/22 1644 65     Resp 08/06/22 1644 12     Temp 08/06/22 1644 97.8 F (36.6 C)     Temp Source 08/06/22 1644 Oral  SpO2 08/06/22 1644 100 %     Weight --      Height --      Head Circumference --      Peak Flow --      Pain Score 08/06/22 1642 7     Pain Loc --      Pain Edu? --      Excl. in [place]? --    No data found.  Updated Vital Signs BP 129/74 (BP Location: Right Arm)   Pulse 65   Temp 97.8 F (36.6 C) (Oral)   Resp 12   LMP 07/27/2022   SpO2 100%   Visual Acuity Right Eye Distance:   Left Eye Distance:   Bilateral Distance:    Right Eye Near:   Left Eye Near:    Bilateral Near:     Physical Exam Vitals reviewed.  Constitutional:      General: She is not in acute distress.    Appearance: She is not ill-appearing, toxic-appearing or diaphoretic.  HENT:     Mouth/Throat:     Mouth: Mucous membranes are moist.  Eyes:     Extraocular Movements: Extraocular movements intact.     Pupils: Pupils are equal, round, and reactive to light.  Cardiovascular:     Rate and Rhythm: Normal rate and regular rhythm.     Heart sounds: No murmur heard. Pulmonary:     Breath sounds: No wheezing, rhonchi or rales.  Abdominal:     General: There is no distension.     Palpations: Abdomen is soft. There is no  mass.     Tenderness: There is no abdominal tenderness.  Musculoskeletal:     Cervical back: Neck supple.  Lymphadenopathy:     Cervical: No cervical adenopathy.  Skin:    Coloration: Skin is not pale.  Neurological:     General: No focal deficit present.     Mental Status: She is alert and oriented to person, place, and time.  Psychiatric:        Behavior: Behavior normal.      UC Treatments / Results  Labs (all labs ordered are listed, but only abnormal results are displayed) Labs Reviewed  URINE CULTURE  CERVICOVAGINAL ANCILLARY ONLY    EKG   Radiology No results found.  Procedures Procedures (including critical care time)  Medications Ordered in UC Medications  ketorolac (TORADOL) 30 MG/ML injection 30 mg (has no administration in time range)    Initial Impression / Assessment and Plan / UC Course  I have reviewed the triage vital signs and the nursing notes.  Pertinent labs & imaging results that were available during my care of the patient were reviewed by me and considered in my medical decision making (see chart for details).        Urinalysis shows a trace of blood little bit of ketones.  There is a trace of protein also.  It is negative for white cells and is negative for nitrites.  Urine culture is sent and vaginal swab is done.  Staff will notify her of any positives.  Naproxen is sent for pain and were given her a Toradol shot before she leaves.  I have asked her to please follow-up with her GYN about this issue Final Clinical Impressions(s) / UC Diagnoses   Final diagnoses:  Pelvic pain in female     Discharge Instructions      Urinalysis had a tiny amount of blood and protein.  Staff will notify you of any  positives on your lab results  Take naproxen 500 mg--1 tablet every 12 hours as needed for pain  Please follow-up with your OB/GYN     ED Prescriptions     Medication Sig Dispense Auth. Provider   naproxen (NAPROSYN) 500 MG  tablet Take 1 tablet (500 mg total) by mouth 2 (two) times daily as needed (pain). 30 tablet Kharter Sestak, Janace Aris, MD      PDMP not reviewed this encounter.   Zenia Resides, MD 08/06/22 1728

## 2022-08-06 NOTE — ED Triage Notes (Signed)
Pt is here for pain on both right and left side , abdominal pain x3 days . Pt states pain comes and goes.ibuprofen taken at 3pm today

## 2022-08-06 NOTE — Progress Notes (Signed)
Virtual Visit Consent   Robin Humphrey, you are scheduled for a virtual visit with a [facility] provider today. Just as with appointments in the office, your consent must be obtained to participate. Your consent will be active for this visit and any virtual visit you may have with one of our providers in the next 365 days. If you have a MyChart account, a copy of this consent can be sent to you electronically.  As this is a virtual visit, video technology does not allow for your provider to perform a traditional examination. This may limit your provider's ability to fully assess your condition. If your provider identifies any concerns that need to be evaluated in person or the need to arrange testing (such as labs, EKG, etc.), we will make arrangements to do so. Although advances in technology are sophisticated, we cannot ensure that it will always work on either your end or our end. If the connection with a video visit is poor, the visit may have to be switched to a telephone visit. With either a video or telephone visit, we are not always able to ensure that we have a secure connection.  By engaging in this virtual visit, you consent to the provision of healthcare and authorize for your insurance to be billed (if applicable) for the services provided during this visit. Depending on your insurance coverage, you may receive a charge related to this service.  I need to obtain your verbal consent now. Are you willing to proceed with your visit today? Jesly Hartmann has provided verbal consent on 08/06/2022 for a virtual visit (video or telephone). Evelina Dun, FNP  Date: 08/06/2022 2:35 PM  Virtual Visit via Video Note   I, Evelina Dun, connected with  Lakedra Washington  ([id number], 1986/07/14) on 08/06/22 at  2:30 PM EST by a video-enabled telemedicine application and verified that I am speaking with the correct person using two identifiers.  Location: Patient: Virtual Visit Location Patient: Home Provider:  Virtual Visit Location Provider: Home Office   I discussed the limitations of evaluation and management by telemedicine and the availability of in person appointments. The patient expressed understanding and agreed to proceed.    History of Present Illness: Robin Humphrey is a 36 y.o. who identifies as a female who was assigned female at birth, and is being seen today for left lower abdominal pain that started a few days ago. States when she drinks soda it is worse. Reports the pain is 8 out 10 that comes and goes. Reports the pain radiates to her back.   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 Take while taking ibuprofen., Disp: 90 tablet, Rfl: 3   PARAGARD INTRAUTERINE COPPER IU, by Intrauterine route., Disp: , Rfl:   Observations/Objective: Patient is well-developed, well-nourished in no acute distress.  Resting comfortably  at home.  Head is normocephalic, atraumatic.  No labored breathing.  Speech is clear and coherent with logical content.  Patient is alert and oriented at baseline.  Left lower pain with  palpation  Assessment and Plan: 1. Left lower quadrant abdominal pain  Recommend her to be seen in person to rule out a more serious problem. Unsure if this related to kidney, colon, or ovaries.   Follow Up Instructions: I discussed the assessment and treatment plan with the patient. The patient was provided an opportunity to ask questions and all were answered. The patient agreed with the plan and  demonstrated an understanding of the instructions.  A copy of instructions were sent to the patient via MyChart unless otherwise noted below.     The patient was advised to call back or seek an in-person evaluation if the symptoms worsen or if the condition fails to improve as anticipated.  Time:  I spent 6 minutes with the patient via telehealth technology discussing the above problems/concerns.    Evelina Dun, FNP

## 2022-08-06 NOTE — Discharge Instructions (Signed)
Urinalysis had a tiny amount of blood and protein.  Staff will notify you of any positives on your lab results  Take naproxen 500 mg--1 tablet every 12 hours as needed for pain  Please follow-up with your OB/GYN

## 2022-08-07 LAB — URINE CULTURE: Culture: NO GROWTH

## 2022-08-07 LAB — CERVICOVAGINAL ANCILLARY ONLY
Bacterial Vaginitis (gardnerella): POSITIVE — AB
Candida Glabrata: NEGATIVE
Candida Vaginitis: NEGATIVE
Chlamydia: NEGATIVE
Comment: NEGATIVE
Comment: NEGATIVE
Comment: NEGATIVE
Comment: NEGATIVE
Comment: NEGATIVE
Comment: NORMAL
Neisseria Gonorrhea: NEGATIVE
Trichomonas: NEGATIVE

## 2022-08-07 LAB — POCT URINALYSIS DIPSTICK, ED / UC
Bilirubin Urine: NEGATIVE
Glucose, UA: NEGATIVE mg/dL
Ketones, ur: 15 mg/dL — AB
Leukocytes,Ua: NEGATIVE
Nitrite: NEGATIVE
Protein, ur: NEGATIVE mg/dL
Specific Gravity, Urine: 1.025 (ref 1.005–1.030)
Urobilinogen, UA: 0.2 mg/dL (ref 0.0–1.0)
pH: 5.5 (ref 5.0–8.0)

## 2022-08-08 ENCOUNTER — Telehealth (HOSPITAL_COMMUNITY): Payer: Self-pay | Admitting: Emergency Medicine

## 2022-08-08 MED ORDER — METRONIDAZOLE 500 MG PO TABS: 500.0000 mg | ORAL_TABLET | Freq: Two times a day (BID) | ORAL | 0 refills | Status: DC

## 2022-08-17 ENCOUNTER — Ambulatory Visit: Payer: Commercial Managed Care - HMO | Attending: Internal Medicine | Admitting: Internal Medicine

## 2022-08-17 ENCOUNTER — Encounter: Payer: Self-pay | Admitting: Internal Medicine

## 2022-08-17 VITALS — BP 113/71 | HR 87 | Ht 67.0 in | Wt 218.0 lb

## 2022-08-17 DIAGNOSIS — I471 Supraventricular tachycardia, unspecified: Secondary | ICD-10-CM | POA: Diagnosis not present

## 2022-08-17 DIAGNOSIS — R002 Palpitations: Secondary | ICD-10-CM

## 2022-08-17 DIAGNOSIS — I300 Acute nonspecific idiopathic pericarditis: Secondary | ICD-10-CM

## 2022-08-17 MED ORDER — METOPROLOL SUCCINATE ER 25 MG PO TB24: 25.0000 mg | ORAL_TABLET | Freq: Every day | ORAL | 3 refills | Status: DC

## 2022-08-17 NOTE — Patient Instructions (Signed)
Medication Instructions:  NO CHANGES  *If you need a refill on your cardiac medications before your next appointment, please call your pharmacy*    Follow-Up: At [facility], you and your health needs are our priority.  As part of our continuing mission to provide you with exceptional heart care, we have created designated Provider Care Teams.  These Care Teams include your primary Cardiologist (physician) and Advanced Practice Providers (APPs -  Physician Assistants and Nurse Practitioners) who all work together to provide you with the care you need, when you need it.  We recommend signing up for the patient portal called "MyChart".  Sign up information is provided on this After Visit Summary.  MyChart is used to connect with patients for Virtual Visits (Telemedicine).  Patients are able to view lab/test results, encounter notes, upcoming appointments, etc.  Non-urgent messages can be sent to your provider as well.   To learn more about what you can do with MyChart, go to [email or web address].    Your next appointment:    12 months with Dr. Rennis Golden or Karoline Caldwell PA

## 2022-08-17 NOTE — Progress Notes (Signed)
OFFICE NOTE  Chief Complaint:  Follow-up  Primary Care Physician: Default, Provider, MD  HPI:  Robin Humphrey is a 36 y.o. female with no significant past medical history.  Over the past several weeks she has developed some chest discomfort and shortness of breath.  She notes it is worse when laying down at night and has been having some associated tachycardia with exertion.  She was seen by urgent care is felt that is possibly anxiety.  She was given some Xanax which she took when she was symptomatic however it just made her tired.  Subsequently she presented to the ER 2 days ago with more symptoms.  She described it is fairly constant however it does wax and wane some.  It seems to be a little worse when laying down.  Is not necessarily worse with exertion or relieved by rest.  There is no family history of early onset heart disease.  She has no known significant risk factors.  She works at [employer] and is under some stress and has a child with autism at home.  She generally follows good pelvic health practices by wearing a mask.  She denies any recent infective symptoms, fevers or generalized malaise.  EKG performed at the hospital was personally reviewed and showed a sinus tachycardia without ST segment changes.  Chest x-ray showed no active cardiopulmonary disease.  Troponins were negative and her D-dimer was negative.  While in the ER she was started on naproxen with a plan for 1 week course of 500 mg twice daily.  This is reasonable treatment as her symptoms might be musculoskeletal chest pain, costochondritis or possibly pericarditis.  11/12/2019  Robin Humphrey returns today for follow-up.  She reports her chest wall discomfort has improved.  She has been taking colchicine now almost 3 months.  She took 2 weeks of naproxen.  She still has some soreness from time to time however the symptoms are improved.  She is also noted improvement in her palpitations on metoprolol.  At first she was  fatigued with this however this improved and she takes it mostly at night.  08/17/2022  Robin Humphrey returns today for follow-up.  She reports she is done pretty well.  She saw Bettina Gavia, PA-C last year and was having some recurrent chest discomfort.  Repeat sedimentation rate was normal but mildly elevated CRP.  She was again treated for pericarditis and seem to improve after anti-inflammatory therapy.  She reports really good control of her palpitations.  She did run out of her Toprol for short period of time and noticed she was having some more palpitations.  PMHx:  Past Medical History:  Diagnosis Date   Ovarian cyst     Past Surgical History:  Procedure Laterality Date   CESAREAN SECTION     IUD INSERTION  01/22/2021        FAMHx:  Family History  Problem Relation Age of Onset   CAD Other    Diabetes Mother    Asthma Sister    Asthma Brother    Heart disease Maternal Aunt    Stroke Maternal Grandmother    Heart disease Maternal Grandmother     No family history of early coronary disease  SOCHx:   reports that she has never smoked. She has never used smokeless tobacco. She reports current alcohol use. She reports that she does not currently use drugs.  ALLERGIES:  No Known Allergies  ROS: Pertinent items noted in HPI and remainder of  comprehensive ROS otherwise negative.  HOME MEDS: Current Outpatient Medications on File Prior to Visit  Medication Sig Dispense Refill   metoprolol succinate (TOPROL XL) 25 MG 24 hr tablet Take 1 tablet (25 mg total) by mouth at bedtime. 90 tablet 0   metroNIDAZOLE (FLAGYL) 500 MG tablet Take 1 tablet (500 mg total) by mouth 2 (two) times daily. 14 tablet 0   naproxen (NAPROSYN) 500 MG tablet Take 1 tablet (500 mg total) by mouth 2 (two) times daily as needed (pain). 30 tablet 0   PARAGARD INTRAUTERINE COPPER IU by Intrauterine route.     pantoprazole (PROTONIX) 40 MG tablet Take 1 tablet (40 mg total) by mouth daily. Take while taking  ibuprofen. 90 tablet 3   No current facility-administered medications on file prior to visit.    LABS/IMAGING: No results found for this or any previous visit (from the past 48 hour(s)). No results found.  LIPID PANEL: No results found for: "CHOL", "TRIG", "HDL", "CHOLHDL", "VLDL", "LDLCALC", "LDLDIRECT"   WEIGHTS: Wt Readings from Last 3 Encounters:  08/17/22 218 lb (98.9 kg)  03/04/21 216 lb 4.8 oz (98.1 kg)  01/21/21 214 lb 9.6 oz (97.3 kg)    VITALS: BP 113/71 (BP Location: Left Arm, Patient Position: Sitting)   Pulse 87   Ht 5\' 7"  (1.702 m)   Wt 218 lb (98.9 kg)   LMP 07/27/2022   SpO2 98%   BMI 34.14 kg/m   EXAM: General appearance: alert and no distress Neck: no carotid bruit, no JVD and thyroid not enlarged, symmetric, no tenderness/mass/nodules Lungs: clear to auscultation bilaterally Heart: regular rate and rhythm, S1, S2 normal, no murmur, click, rub or gallop Abdomen: soft, non-tender; bowel sounds normal; no masses,  no organomegaly Extremities: extremities normal, atraumatic, no cyanosis or edema Pulses: 2+ and symmetric Skin: Skin color, texture, turgor normal. No rashes or lesions Neurologic: Grossly normal Psych: Pleasant  EKG: Normal sinus rhythm at 87  ASSESSMENT: Atypical chest pain, worse lying down Possible pericarditis or costochondritis Palpitations  PLAN: 1.   Robin Humphrey says she has not had any further episodes of pericarditis over the past years since she was treated for possible repeat pericarditis.  At that time she had a normal sed rate but mildly elevated CRP.  Initially her sed rate was as high as 38 but has been negative otherwise.  She did have some SVT on a monitor several years ago.  Generally this has been well controlled on Toprol.  We will continue that.  Plan follow-up annually with APP or me.  Katrinka Blazing, MD, [facility], FACP  [facility]  Medical Director of the [facility]  [facility]  [facility] Diplomate of the American Board of Clinical Lipidology Attending Cardiologist  Direct Dial: [phone]  Fax: [phone]  Website:  [email or web address] Niti Leisure 08/17/2022, 9:55 AM

## 2023-04-01 ENCOUNTER — Ambulatory Visit (HOSPITAL_COMMUNITY)
Admission: EM | Admit: 2023-04-01 | Discharge: 2023-04-01 | Disposition: A | Discharge status: Home / Self Care | Payer: Commercial Managed Care - HMO | Attending: Physician Assistant | Admitting: Physician Assistant

## 2023-04-01 ENCOUNTER — Encounter (HOSPITAL_COMMUNITY): Payer: Self-pay

## 2023-04-01 DIAGNOSIS — N898 Other specified noninflammatory disorders of vagina: Secondary | ICD-10-CM | POA: Insufficient documentation

## 2023-04-01 DIAGNOSIS — N760 Acute vaginitis: Secondary | ICD-10-CM | POA: Diagnosis present

## 2023-04-01 DIAGNOSIS — Z113 Encounter for screening for infections with a predominantly sexual mode of transmission: Secondary | ICD-10-CM | POA: Insufficient documentation

## 2023-04-01 LAB — HIV ANTIBODY (ROUTINE TESTING W REFLEX): HIV Screen 4th Generation wRfx: NONREACTIVE

## 2023-04-01 MED ORDER — FLUCONAZOLE 150 MG PO TABS: 150.0000 mg | ORAL_TABLET | ORAL | 0 refills | Status: DC | PRN

## 2023-04-01 NOTE — ED Triage Notes (Signed)
Patient c/o vaginal irritation and itching x 2 weeks. Patient reports a history of BV.   Patient states she tried Monistat OTC, but did not help.

## 2023-04-01 NOTE — ED Provider Notes (Signed)
[facility]    CSN: [id number] Arrival date & time: 04/01/23  1007      History   Chief Complaint No chief complaint on file.   HPI Robin Humphrey is a 37 y.o. female.   Patient presents today with several week history of vaginal irritation.  She reports itching and burning sensation in the vagina.  She has had some vaginal discharge but states this is chronic since her IUD was placed several years ago and is unchanged from baseline.  She denies any pelvic pain, abdominal pain, fever, nausea, vomiting.  She denies any changes to medications or recent antibiotic use.  Does report changing her soap and wonders if this could have triggered her symptoms.  She is no specific intent for STI but is interested in complete STI panel.  She has tried Monistat over-the-counter without improvement of symptoms.  She denies any history of diabetes and does not take SGLT2 inhibitor.  She is confident that she is not pregnant as she has an IUD.    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 Authorizing Provider  fluconazole (DIFLUCAN) 150 MG tablet Take 1 tablet (150 mg total) by mouth every 3 (three) days as needed. 04/01/23  Yes Tonja Jezewski K, PA-C  metoprolol succinate (TOPROL XL) 25 MG 24 hr tablet Take 1 tablet (25 mg total) by mouth at bedtime. 08/17/22   Hilty, Lisette Abu, MD   PARAGARD INTRAUTERINE COPPER IU by Intrauterine route. 01/21/21   [provider]    Family History Family History  Problem Relation Age of Onset   Diabetes Mother    Asthma Sister    Asthma Brother    Stroke Maternal Grandmother    Heart disease Maternal Grandmother    Heart disease Maternal Aunt    CAD Other     Social History Social History   Tobacco Use   Smoking status: Never   Smokeless tobacco: Never  Vaping Use   Vaping Use: Never used  Substance Use Topics   Alcohol use: Yes    Comment: occasionally   Drug use: Never     Allergies   Patient has no known allergies.   Review of Systems Review of Systems  Constitutional:  Positive for activity change. Negative for appetite change, fatigue and fever.  Gastrointestinal:  Negative for abdominal pain, diarrhea, nausea and vomiting.  Genitourinary:  Positive for vaginal discharge and vaginal pain (Irritation). Negative for dysuria, frequency, pelvic pain, urgency and vaginal bleeding.     Physical Exam Triage Vital Signs ED Triage  Vitals  Enc Vitals Group     BP 04/01/23 1037 128/80     Pulse Rate 04/01/23 1037 79     Resp 04/01/23 1037 16     Temp 04/01/23 1037 98 F (36.7 C)     Temp Source 04/01/23 1037 Oral     SpO2 04/01/23 1037 97 %     Weight --      Height --      Head Circumference --      Peak Flow --      Pain Score 04/01/23 1039 5     Pain Loc --      Pain Edu? --      Excl. in GC? --    No data found.  Updated Vital Signs BP 128/80 (BP Location: Right Arm)   Pulse 79   Temp 98 F (36.7 C) (Oral)   Resp 16   SpO2 97%   Visual Acuity Right Eye Distance:   Left Eye Distance:   Bilateral Distance:    Right Eye Near:   Left Eye Near:    Bilateral Near:     Physical Exam Vitals reviewed.  Constitutional:      General: She is awake. She is not in acute distress.    Appearance: Normal appearance. She is well-developed. She is not ill-appearing.     Comments: Very  pleasant female appears stated age in no acute distress sitting comfortably in exam room  HENT:     Head: Normocephalic and atraumatic.  Cardiovascular:     Rate and Rhythm: Normal rate and regular rhythm.     Heart sounds: Normal heart sounds, S1 normal and S2 normal. No murmur heard. Pulmonary:     Effort: Pulmonary effort is normal.     Breath sounds: Normal breath sounds. No wheezing, rhonchi or rales.     Comments: Clear to auscultation bilaterally Abdominal:     General: Bowel sounds are normal.     Palpations: Abdomen is soft.     Tenderness: There is no abdominal tenderness. There is no right CVA tenderness, left CVA tenderness, guarding or rebound.     Comments: Benign abdominal exam  Genitourinary:    Comments: Exam deferred Psychiatric:        Behavior: Behavior is cooperative.      UC Treatments / Results  Labs (all labs ordered are listed, but only abnormal results are displayed) Labs Reviewed  HIV ANTIBODY (ROUTINE TESTING W REFLEX)  RPR  CERVICOVAGINAL ANCILLARY ONLY    EKG   Radiology No results found.  Procedures Procedures (including critical care time)  Medications Ordered in UC Medications - No data to display  Initial Impression / Assessment and Plan / UC Course  I have reviewed the triage vital signs and the nursing notes.  Pertinent labs & imaging results that were available during my care of the patient were reviewed by me and considered in my medical decision making (see chart for details).     Patient is well-appearing, afebrile, nontoxic, nontachycardic.  Concern for yeast vaginitis given clinical presentation.  Will empirically treat with Diflucan 1 dose today and a second dose in 3 days.  STI swab was collected and is pending.  We will contact her if we need to arrange additional treatment based on her swab results.  HIV and syphilis testing was obtained and is pending.  Discussed the importance of safe sex practices.  She is to wear  loosefitting cotton underwear and use hypoallergenic soaps and detergents.  We discussed that if she has any worsening or changing symptoms including pain, pelvic pain, strict return precautions given.  Final Clinical Impressions(s) / UC Diagnoses   Final diagnoses:  Acute vaginitis  Vaginal irritation  Screening examination for STI     Discharge Instructions      We are treating you for a yeast infection.  Please take Diflucan today and a second dose in 3 days if needed.  Wear loosefitting cotton read use hypoallergenic soaps and detergents.  We will contact you if need to arrange additional treatment based on your swab results.  Monitor your MyChart for your results.  Abstain from sex until you receive results.  Use [employer] sexual encounter.  If you have any abdominal pain, pelvic pain, fever, abnormal discharge, nausea, vomiting you need to be seen immediately.     ED Prescriptions     Medication Sig Dispense Auth. Provider   fluconazole (DIFLUCAN) 150 MG tablet Take 1 tablet (150 mg total) by mouth every 3 (three) days as needed. 2 tablet Quandra Fedorchak, Noberto Retort, PA-C      PDMP not reviewed this encounter.   Jeani Hawking, PA-C 04/01/23 1059

## 2023-04-01 NOTE — Discharge Instructions (Signed)
We are treating you for a yeast infection.  Please take Diflucan today and a second dose in 3 days if needed.  Wear loosefitting cotton read use hypoallergenic soaps and detergents.  We will contact you if need to arrange additional treatment based on your swab results.  Monitor your MyChart for your results.  Abstain from sex until you receive results.  Use [employer] sexual encounter.  If you have any abdominal pain, pelvic pain, fever, abnormal discharge, nausea, vomiting you need to be seen immediately.

## 2023-04-02 LAB — CERVICOVAGINAL ANCILLARY ONLY
Bacterial Vaginitis (gardnerella): POSITIVE — AB
Candida Glabrata: NEGATIVE
Candida Vaginitis: NEGATIVE
Chlamydia: NEGATIVE
Comment: NEGATIVE
Comment: NEGATIVE
Comment: NEGATIVE
Comment: NEGATIVE
Comment: NEGATIVE
Comment: NORMAL
Neisseria Gonorrhea: NEGATIVE
Trichomonas: NEGATIVE

## 2023-04-02 LAB — RPR: RPR Ser Ql: NONREACTIVE

## 2023-04-03 ENCOUNTER — Telehealth (HOSPITAL_COMMUNITY): Payer: Self-pay | Admitting: Emergency Medicine

## 2023-04-03 MED ORDER — METRONIDAZOLE 500 MG PO TABS: 500.0000 mg | ORAL_TABLET | Freq: Two times a day (BID) | ORAL | 0 refills | Status: DC

## 2023-04-11 ENCOUNTER — Encounter: Payer: Self-pay | Admitting: Internal Medicine

## 2023-04-11 ENCOUNTER — Ambulatory Visit (INDEPENDENT_AMBULATORY_CARE_PROVIDER_SITE_OTHER): Payer: Commercial Managed Care - HMO | Admitting: Internal Medicine

## 2023-04-11 VITALS — BP 114/66 | HR 86 | Temp 98.1°F | Ht 67.0 in | Wt 225.2 lb

## 2023-04-11 DIAGNOSIS — B9689 Other specified bacterial agents as the cause of diseases classified elsewhere: Secondary | ICD-10-CM | POA: Insufficient documentation

## 2023-04-11 DIAGNOSIS — N760 Acute vaginitis: Secondary | ICD-10-CM | POA: Diagnosis not present

## 2023-04-11 DIAGNOSIS — Z30432 Encounter for removal of intrauterine contraceptive device: Secondary | ICD-10-CM | POA: Diagnosis not present

## 2023-04-11 DIAGNOSIS — B009 Herpesviral infection, unspecified: Secondary | ICD-10-CM

## 2023-04-11 DIAGNOSIS — I471 Supraventricular tachycardia, unspecified: Secondary | ICD-10-CM

## 2023-04-11 DIAGNOSIS — N644 Mastodynia: Secondary | ICD-10-CM

## 2023-04-11 HISTORY — DX: Herpesviral infection, unspecified: B00.9

## 2023-04-11 NOTE — Patient Instructions (Signed)
Reduce caffeine intake  I will contact you when I get your mammogram and ultrasound result.

## 2023-04-11 NOTE — Progress Notes (Signed)
[facility] [address] [place] [address] Dept: [phone] Dept Fax: [phone]  New Patient Office Visit  Subjective:   Robin Humphrey 04/16/1986 04/11/2023  Chief Complaint  Patient presents with   Breast Pain   Establish Care    HPI: [name] presents today to establish care at [facility]. Introduced to Publishing rights manager role and practice setting.  All questions answered.   Concerns: See below   Discussed the use of AI scribe software for clinical note transcription with the patient, who gave verbal consent to proceed.  History of Present Illness   The patient, with a history of a right breast cyst, presents with left breast pain ongoing for approximately 1 week. They report that their breasts typically become tender around the time of their menstrual cycle, but this time the pain persisted after their period ended. The pain is described as 'real, real, real tender' and is localized to the left breast. They have been managing the pain with ibuprofen, hot baths, and a heating pad, which provides some relief. They deny fever, chills, nipple discharge, and rash. They have a family history of breast cancer on their mother's side, with an aunt who was diagnosed after the age of 53 and passed away in 27-Apr-2018.  The patient also reports a history of tachycardia, which is well controlled with metoprolol. They have an IUD, which they plan to have removed due to recurrent bacterial vaginosis. They are currently taking metronidazole for this issue. They also have a prescription for valacyclovir, which they take as needed for cold sores. Currently no HSV1 outbreak.       The following portions of the patient's history were reviewed and updated as appropriate: past medical history, past surgical history, family history, social history, allergies, medications, and problem list.   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   Palpitations 11/16/2020   PSVT (paroxysmal supraventricular tachycardia) 11/16/2020   Left ovarian cyst 11/13/2018   Past Medical History:  Diagnosis Date   Ovarian cyst    Past Surgical History:  Procedure Laterality Date   CESAREAN SECTION     IUD INSERTION  01/22/2021       Family History  Problem Relation Age of Onset   Diabetes Mother    Asthma Sister    Asthma Brother    Stroke Maternal Grandmother    Heart disease Maternal Grandmother    Heart disease Maternal Aunt    CAD Other    Outpatient Medications Prior to Visit  Medication Sig Dispense Refill   metoprolol succinate (TOPROL XL) 25 MG 24 hr tablet Take 1 tablet (25 mg total) by mouth at bedtime. 90 tablet 3   metroNIDAZOLE (FLAGYL) 500 MG tablet Take 1 tablet (500 mg total) by mouth 2 (two) times daily. 14 tablet 0   PARAGARD INTRAUTERINE COPPER IU by Intrauterine route.     valACYclovir (VALTREX) 1000 MG tablet Take 1,000 mg by mouth daily as needed (cold sores).     fluconazole (DIFLUCAN) 150 MG tablet Take 1 tablet (150 mg total) by mouth every 3 (three) days as needed. 2 tablet 0   No facility-administered medications prior to visit.   No Known Allergies  ROS: A complete ROS was performed with pertinent positives/negatives noted in the HPI. The remainder of the ROS are negative.   Objective:   Today's  Vitals   04/11/23 1426  BP: 114/66  Pulse: 86  Temp: 98.1 F (36.7 C)  TempSrc: Temporal  SpO2: 99%  Weight: 225 lb 3.2 oz (102.2 kg)  Height: 5\' 7"  (1.702 m)   GENERAL: Well-appearing, in NAD. Well nourished.  SKIN: Pink, warm and dry. No rash, lesion, ulceration, or ecchymoses.  NECK: Trachea midline. Full ROM w/o pain or tenderness. No lymphadenopathy.  BREASTS: Breasts pendulous, symmetrical, and w/o palpable masses. Nipples everted and w/o discharge. No rash or  skin retraction. No axillary or supraclavicular lymphadenopathy.  RESPIRATORY: Chest wall symmetrical. Respirations even and non-labored. Breath sounds clear to auscultation bilaterally.  CARDIAC: S1, S2 present, regular rate and rhythm. Peripheral pulses 2+ bilaterally.  EXTREMITIES: Without clubbing, cyanosis, or edema.  PSYCH/MENTAL STATUS: Alert, oriented x 3. Cooperative, appropriate mood and affect.   Health Maintenance Due  Topic Date Due   Hepatitis C Screening  Never done   DTaP/Tdap/Td (2 - Td or Tdap) 12/20/2008   COVID-19 Vaccine (3 - 2023-24 season) 06/02/2022   PAP SMEAR-Modifier  11/26/2022    No results found for any visits on 04/11/23.     Assessment & Plan:  Assessment and Plan    Left Breast Pain: Recent onset of persistent left breast pain, initially associated with menstrual cycle but continued after menstruation. No palpable mass, nipple discharge, or rash. History of right breast cyst. Family history of breast cancer on maternal side. -Order diagnostic mammogram and ultrasound of left breast. -Advise to reduce caffeine intake which may contribute to breast tenderness.  Tachycardia: Well controlled on Metoprolol. -Continue Metoprolol.  Intrauterine Device (IUD) related issues: Recurrent bacterial vaginosis (BV) and desire for IUD removal. -Continue Metronidazole for BV. -Refer to OB/GYN for IUD removal.  General Health Maintenance / Followup Plans: -Schedule annual physical in 3 months, including fasting blood work for cholesterol, thyroid levels, and diabetes screening due to family history. -Consider updating tetanus booster and other vaccinations as needed during physical.        Return in about 3 months (around 07/12/2023) for Fasting Annual Physical Exam.   Of note, portions of this note may have been created with voice recognition software Physicist, medical). While this note has been edited for accuracy, occasional wrong-word or 'sound-a-like'  substitutions may have occurred due to the inherent limitations of voice recognition software.  Salvatore Decent, FNP

## 2023-04-20 ENCOUNTER — Other Ambulatory Visit: Payer: Self-pay | Admitting: Internal Medicine

## 2023-04-20 ENCOUNTER — Ambulatory Visit
Admission: RE | Admit: 2023-04-20 | Discharge: 2023-04-20 | Disposition: A | Discharge status: Home / Self Care | Payer: Commercial Managed Care - HMO | Source: Ambulatory Visit | Attending: Internal Medicine | Admitting: Internal Medicine

## 2023-04-20 DIAGNOSIS — Z30432 Encounter for removal of intrauterine contraceptive device: Secondary | ICD-10-CM

## 2023-04-20 DIAGNOSIS — N6320 Unspecified lump in the left breast, unspecified quadrant: Secondary | ICD-10-CM

## 2023-04-20 DIAGNOSIS — N644 Mastodynia: Secondary | ICD-10-CM

## 2023-04-20 DIAGNOSIS — B9689 Other specified bacterial agents as the cause of diseases classified elsewhere: Secondary | ICD-10-CM

## 2023-04-20 DIAGNOSIS — B009 Herpesviral infection, unspecified: Secondary | ICD-10-CM

## 2023-04-20 DIAGNOSIS — I471 Supraventricular tachycardia, unspecified: Secondary | ICD-10-CM

## 2023-04-24 ENCOUNTER — Ambulatory Visit
Admission: RE | Admit: 2023-04-24 | Discharge: 2023-04-24 | Disposition: A | Discharge status: Home / Self Care | Payer: Commercial Managed Care - HMO | Source: Ambulatory Visit | Attending: Internal Medicine | Admitting: Internal Medicine

## 2023-04-24 DIAGNOSIS — N6320 Unspecified lump in the left breast, unspecified quadrant: Secondary | ICD-10-CM

## 2023-04-24 HISTORY — PX: BREAST BIOPSY: SHX20

## 2023-04-25 ENCOUNTER — Ambulatory Visit: Payer: Commercial Managed Care - HMO | Admitting: Internal Medicine

## 2023-04-26 ENCOUNTER — Encounter: Payer: Self-pay | Admitting: Internal Medicine

## 2023-04-26 DIAGNOSIS — D242 Benign neoplasm of left breast: Secondary | ICD-10-CM | POA: Insufficient documentation

## 2023-04-26 HISTORY — DX: Benign neoplasm of left breast: D24.2

## 2023-05-22 ENCOUNTER — Encounter: Payer: Self-pay | Admitting: Advanced Practice Midwife

## 2023-05-23 ENCOUNTER — Other Ambulatory Visit: Payer: Self-pay

## 2023-05-23 ENCOUNTER — Other Ambulatory Visit (HOSPITAL_COMMUNITY)
Admission: RE | Admit: 2023-05-23 | Discharge: 2023-05-23 | Disposition: A | Discharge status: Home / Self Care | Payer: Commercial Managed Care - HMO | Source: Ambulatory Visit | Attending: Advanced Practice Midwife | Admitting: Advanced Practice Midwife

## 2023-05-23 ENCOUNTER — Ambulatory Visit: Payer: Commercial Managed Care - HMO | Admitting: Advanced Practice Midwife

## 2023-05-23 VITALS — BP 127/83 | HR 66 | Ht 67.0 in | Wt 224.3 lb

## 2023-05-23 DIAGNOSIS — N939 Abnormal uterine and vaginal bleeding, unspecified: Secondary | ICD-10-CM | POA: Diagnosis not present

## 2023-05-23 DIAGNOSIS — Z3009 Encounter for other general counseling and advice on contraception: Secondary | ICD-10-CM

## 2023-05-23 DIAGNOSIS — Z124 Encounter for screening for malignant neoplasm of cervix: Secondary | ICD-10-CM | POA: Insufficient documentation

## 2023-05-23 DIAGNOSIS — Z01419 Encounter for gynecological examination (general) (routine) without abnormal findings: Secondary | ICD-10-CM

## 2023-05-23 DIAGNOSIS — Z30432 Encounter for removal of intrauterine contraceptive device: Secondary | ICD-10-CM

## 2023-05-23 NOTE — Progress Notes (Signed)
GYNECOLOGY ANNUAL PREVENTATIVE CARE ENCOUNTER NOTE  History:     Robin Humphrey is a 37 y.o. [phone] female here for IU removal, Pap smear and contraceptive counseling.  Current complaints: heavy and irregular bleeding on ParaGard IUD that she has had since 2022. Had heavy periods before. Cannot tolerate irreg bleeding with IUD. Wants to go back to [facility]. Previously decided against Mirena due to concern about ovarian cysts.   Denies concern for STI. Has had BV more often since having IUD. States she is not sexually active. Denies discharge, pelvic pain, problems with intercourse or other gynecologic concerns.    Gynecologic History Patient's last menstrual period was 05/22/2023 (exact date). Contraception: IUD Last Pap: 2021. Results were: normal with negative HPV Last mammogram: 2024. Results were: abnormal. Bx showed fibroadenoma.  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 90 tablet 3   valACYclovir (VALTREX) 1000 MG tablet Take 1,000 mg by mouth daily as needed (cold sores).     No current  facility-administered medications on file prior to visit.    No Known Allergies  Social History:  reports that she has never smoked. She has never used smokeless tobacco. She reports current alcohol use. She reports that she does not use drugs.  Family History  Problem Relation Age of Onset   Diabetes Mother    Asthma Sister    Breast cancer Maternal Aunt    Heart disease Maternal Aunt    Stroke Maternal Grandmother    Heart disease Maternal Grandmother    Asthma Brother    CAD Other     The following portions of the patient's history were reviewed and updated as appropriate: allergies, current medications, past family history, past medical history, past social history, past surgical history and problem list.  Review of Systems Review of Systems   Physical Exam:  BP 127/83   Pulse 66   Ht 5\' 7"  (1.702 m)   Wt 224 lb 4.8 oz (101.7 kg)   LMP 05/22/2023 (Exact Date)   BMI 35.13 kg/m  CONSTITUTIONAL: Well-developed, well-nourished female in no acute distress.  HENT:  Normocephalic, atraumatic. Oropharynx is clear and moist EYES: Conjunctivae normal. No scleral icterus.  SKIN: Skin is warm and  dry. No rash noted. Not diaphoretic. No erythema. No pallor. MUSCULOSKELETAL: Normal range of motion. No tenderness.  No cyanosis or edema.   NEUROLOGIC: Alert and oriented to person, place, and time. Normal muscle tone coordination.  PSYCHIATRIC: Normal mood and affect. Normal behavior. Normal judgment and thought content. CARDIOVASCULAR: Normal heart rate noted. RESPIRATORY: Effort and rate normal. BREASTS: Declined ABDOMEN: Soft, no distention, tenderness, rebound or guarding.  PELVIC: Normal appearing external genitalia; normal appearing vaginal mucosa and cervix.  No abnormal discharge noted.  Pap smear obtained. ID strings seen.  Procedure Note: ParaGard IUD removal Timeout performed. Speculum placed. IUD strings visualized protruding through os. Strings grasped with ring forceps.  IUD removed without difficulty. Patient tolerated the procedure well. EBL: Scant. IUD shown to patient. Informed that she should anticipate immediate return to fertility and start other contraception immediately if not trying to conceive. Instructed to call office or go to MAU/ED for fever greater than 100.4, heavy bleeding or severe abdominal pain.     Assessment and Plan:    1. Cervical cancer screening  - Cytology - PAP( [place])  2. Encounter for IUD removal - OTC IBU PRN for cramping  3. Abnormal uterine bleeding (AUB)  - norgestimate-ethinyl estradiol (ORTHO-CYCLEN) 0.25-35 MG-MCG tablet; Take 1 tablet by mouth daily.  Dispense: 28 tablet; Refill: 12  4. Encounter for general counseling on prescription of oral contraceptives  - norgestimate-ethinyl estradiol (ORTHO-CYCLEN) 0.25-35 MG-MCG tablet; Take 1 tablet by mouth daily.  Dispense: 28 tablet; Refill: 12  Will follow up results of pap smear and manage accordingly. Mammogram due at age 79 [facility] recommendation. Routine preventative health maintenance measures emphasized. Please refer to After Visit Summary for other counseling recommendations.      [place], [place] 05/23/2023 9:52 AM

## 2023-05-24 ENCOUNTER — Ambulatory Visit: Payer: Commercial Managed Care - HMO | Admitting: Obstetrics and Gynecology

## 2023-05-24 ENCOUNTER — Encounter: Payer: Self-pay | Admitting: Obstetrics and Gynecology

## 2023-05-24 VITALS — BP 131/86 | HR 67 | Wt 224.4 lb

## 2023-05-24 DIAGNOSIS — I471 Supraventricular tachycardia, unspecified: Secondary | ICD-10-CM

## 2023-05-24 DIAGNOSIS — Z30430 Encounter for insertion of intrauterine contraceptive device: Secondary | ICD-10-CM | POA: Insufficient documentation

## 2023-05-24 NOTE — Patient Instructions (Signed)
IUD PLACEMENT POST-PROCEDURE INSTRUCTIONS ° °You may take Ibuprofen, Aleve or Tylenol for pain if needed.  Cramping should resolve within in 24 hours. ° °You may have a small amount of spotting.  You should wear a mini pad for the next few days. ° °You may have intercourse after 24 hours.  If you using this for birth control, it is effective immediately. ° °You need to call if you have any pelvic pain, fever, heavy bleeding or foul smelling vaginal discharge.  Irregular bleeding is common the first several months after having an IUD placed. You do not need to call for this reason unless you are concerned. ° °Shower or bathe as normal ° °You should have a follow-up appointment in 4-8 weeks for a re-check to make sure you are not having any problems. °

## 2023-05-24 NOTE — Progress Notes (Signed)
   GYNECOLOGY CLINIC PROCEDURE NOTE  Robin Humphrey is a 37 y.o. [phone] here for Mirena IUD insertion. No GYN concerns.  Last pap smear was on 05/23/23 and awaiting results  IUD Insertion Procedure Note Patient identified, informed consent performed, consent signed.   Discussed risks of irregular bleeding, cramping, infection, malpositioning or misplacement of the IUD outside the uterus which may require further procedure such as laparoscopy. Time out was performed.  Urine pregnancy test negative.  Speculum placed in the vagina.  Cervix visualized.  Cleaned with Betadine x 2.  Grasped anteriorly with a single tooth tenaculum.  Uterus sounded to 7 cm.  Mirena IUD placed per manufacturer's recommendations.  Strings trimmed to 3 cm. Tenaculum was removed, good hemostasis noted.  Patient tolerated procedure well.   Patient was given post-procedure instructions.  She was advised to have backup contraception for one week.  Patient was also asked to check IUD strings periodically and follow up in 4 weeks for IUD check.

## 2023-05-25 MED ORDER — NORGESTIMATE-ETH ESTRADIOL 0.25-35 MG-MCG PO TABS: 1.0000 | ORAL_TABLET | Freq: Every day | ORAL | 12 refills | Status: DC

## 2023-05-25 MED ORDER — LEVONORGESTREL 20 MCG/DAY IU IUD
1.0000 | INTRAUTERINE_SYSTEM | Freq: Once | INTRAUTERINE | Status: AC
  Administered 2023-05-24: 1 via INTRAUTERINE

## 2023-05-25 NOTE — Addendum Note (Signed)
Addended by: Marjo Bicker on: 05/25/2023 12:28 PM   Modules accepted: Orders

## 2023-05-28 LAB — CYTOLOGY - PAP
Comment: NEGATIVE
Diagnosis: UNDETERMINED — AB
High risk HPV: NEGATIVE

## 2023-06-21 ENCOUNTER — Ambulatory Visit: Payer: Commercial Managed Care - HMO | Admitting: Obstetrics and Gynecology

## 2023-06-21 ENCOUNTER — Encounter: Payer: Self-pay | Admitting: Obstetrics and Gynecology

## 2023-06-21 ENCOUNTER — Other Ambulatory Visit: Payer: Self-pay

## 2023-06-21 VITALS — BP 124/85 | HR 65 | Wt 223.7 lb

## 2023-06-21 DIAGNOSIS — Z30431 Encounter for routine checking of intrauterine contraceptive device: Secondary | ICD-10-CM

## 2023-06-21 DIAGNOSIS — Z975 Presence of (intrauterine) contraceptive device: Secondary | ICD-10-CM

## 2023-06-22 NOTE — Progress Notes (Signed)
GYNECOLOGY VISIT  Patient name: Robin Humphrey MRN [id number]  Date of birth: Oct 05, 1985 Chief Complaint:   Gynecologic Exam  History:  Robin Humphrey is a 37 y.o. [id number] being seen today for IUD string check. First menses since IUD insertion was very light.     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   Health Maintenance:   Last pap     Component Value Date/Time   DIAGPAP (A) 05/23/2023 0946    - Atypical squamous cells of undetermined significance (ASC-US)   DIAGPAP  11/27/2019 1329    - Negative for intraepithelial lesion or malignancy (NILM)   DIAGPAP  10/14/2018 0000    NEGATIVE FOR INTRAEPITHELIAL LESIONS OR MALIGNANCY.   HPVHIGH Negative 05/23/2023 0946   HPVHIGH Negative 11/27/2019 1329   ADEQPAP  05/23/2023 0946    Satisfactory but limited for evaluation with partially obscuring blood;   ADEQPAP transformation zone component present. 05/23/2023 0946   ADEQPAP  11/27/2019 1329    Satisfactory for evaluation; transformation zone component PRESENT.    High Risk HPV: Positive  Adequacy:  Satisfactory for evaluation, transformation zone component PRESENT  Diagnosis:  Atypical squamous cells of undetermined significance (ASC-US)  Last mammogram: n/a   Review of Systems:  Pertinent items are noted in HPI. Comprehensive review of systems  was otherwise negative.   Objective:  Physical Exam BP 124/85   Pulse 65   Wt 223 lb 11.2 oz (101.5 kg)   LMP 06/17/2023 (Exact Date)   BMI 35.04 kg/m    Physical Exam Vitals and nursing note reviewed. Exam conducted with a chaperone present.  Constitutional:      Appearance: Normal appearance.  HENT:     Head: Normocephalic and atraumatic.  Pulmonary:     Effort: Pulmonary effort is normal.     Breath sounds: Normal breath sounds.  Genitourinary:    General: Normal vulva.     Exam position: Lithotomy position.     Vagina: Normal.     Cervix: Normal.     Comments: IUD strings visualized Skin:    General: Skin is warm and dry.  Neurological:     General: No focal deficit present.     Mental Status: She is alert.  Psychiatric:        Mood and Affect: Mood normal.        Behavior: Behavior normal.        Thought Content: Thought content normal.        Judgment: Judgment normal.       Assessment & Plan:   1. IUD (intrauterine device) in place IUD confirmed in place   Routine preventative health maintenance measures emphasized.  Lorriane Shire, MD [facility], [facility]

## 2023-06-29 ENCOUNTER — Other Ambulatory Visit: Payer: Self-pay

## 2023-06-29 MED ORDER — METOPROLOL SUCCINATE ER 25 MG PO TB24: 25.0000 mg | ORAL_TABLET | Freq: Every day | ORAL | 0 refills | Status: AC

## 2023-07-02 NOTE — Progress Notes (Unsigned)
Subjective:   Robin Humphrey [birth year]  07/03/2023   CC: No chief complaint on file.   HPI: Robin Humphrey is a 37 y.o. female who presents for a routine health maintenance exam.  Labs collected at time of visit.    HEALTH SCREENINGS: - Pap smear: up to date - Mammogram (40+): Not applicable  - Colonoscopy (45+): Not applicable  - Bone Density (65+): Not applicable  - Lung CA screening with low-dose CT:  Not applicable Adults age 61-80 who are current cigarette smokers or quit within the last 15 years. Must have 20 pack year history.   Depression and Anxiety Screen done today and results listed below:     06/21/2023    4:26 PM 05/24/2023   12:26 PM 05/23/2023    9:00 AM 04/11/2023    2:36 PM 03/14/2021    4:22 PM  Depression screen PHQ 2/9  Decreased Interest 1 0 1 1 0  Down, Depressed, Hopeless 0 0 0 0 0  PHQ - 2 Score 1 0 1 1 0  Altered sleeping 2 1 1 1  0  Tired, decreased energy 0 1 1 1 1   Change in appetite 1 0 1 2 1   Feeling bad or failure about yourself  0 0 0 0 0  Trouble concentrating 0 0 0 0 0  Moving slowly or fidgety/restless 0 0 0 0 0  Suicidal thoughts 0 0 0 0 0  PHQ-9 Score 4 2 4 5 2   Difficult doing work/chores    Not difficult at all       06/21/2023    4:26 PM 05/24/2023   12:26 PM 05/23/2023    9:00 AM 04/11/2023    2:36 PM  GAD 7 : Generalized Anxiety Score  Nervous, Anxious, on Edge 0 0 0 0  Control/stop worrying 0 0 1 0  Worry too much - different things 1 0 0 0  Trouble relaxing 0 1 1 0  Restless 0 0 0 0  Easily annoyed or irritable 1 1 1  0  Afraid - awful might happen 0 0 0 0  Total GAD 7 Score 2 2 3  0  Anxiety Difficulty    Not difficult at all    IMMUNIZATIONS: - Tdap: Tetanus vaccination status reviewed: {tetanus status:315746} - Influenza: {Blank single:19197::"Up to date","Administered today","Postponed to flu season","Refused","Given elsewhere"} - Pneumovax: Not applicable - Prevnar 20: Not applicable - Zostavax (50+): Not  applicable   Past medical history, surgical history, medications, allergies, family history and social history reviewed with patient today and changes made to appropriate areas of the chart.   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 Please call [phone] to schedule an appointment with Dr. Rennis Golden for future refills. Thank you.   norgestimate-ethinyl estradiol (ORTHO-CYCLEN) 0.25-35 MG-MCG tablet Take 1 tablet by mouth daily. (Patient not taking:  Reported on 06/21/2023)   valACYclovir (VALTREX) 1000 MG tablet Take 1,000 mg by mouth daily as needed (cold sores).   No current facility-administered medications on file prior to visit.    No Known Allergies   Social History   Socioeconomic History   Marital status: Single    Spouse name: Not on file   Number of children: Not on file   Years of education: Not on file   Highest education level: Bachelor's degree (e.g., BA, AB, BS)  Occupational History   Not on file  Tobacco Use   Smoking status: Never   Smokeless tobacco: Never  Vaping Use   Vaping status: Never Used  Substance and Sexual Activity   Alcohol use: Yes    Comment: occasionally   Drug use: Never   Sexual activity: Yes    Birth control/protection: None  Other Topics Concern   Not on file  Social History Narrative   Not on file   Social Determinants of Health   Financial Resource  Strain: Low Risk  (05/23/2023)   Overall Financial Resource Strain (CARDIA)    Difficulty of Paying Living Expenses: Not hard at all  Food Insecurity: No Food Insecurity (05/23/2023)   Hunger Vital Sign    Worried About Running Out of Food in the Last Year: Never true    Ran Out of Food in the Last Year: Never true  Transportation Needs: No Transportation Needs (05/23/2023)   PRAPARE - Administrator, Civil Service (Medical): No    Lack of Transportation (Non-Medical): No  Physical Activity: Unknown (05/23/2023)   Exercise Vital Sign    Days of Exercise per Week: Patient declined    Minutes of Exercise per Session: Not on file  Stress: No Stress Concern Present (05/23/2023)   [employer] of Occupational Health - Occupational Stress Questionnaire    Feeling of Stress : Not at all  Social Connections: Unknown (05/23/2023)   Social Connection and Isolation Panel [NHANES]    Frequency of Communication with Friends and Family: More than three times a week    Frequency of Social Gatherings with Friends and Family: Once a week    Attends Religious Services: Patient declined    Database administrator or Organizations: No    Attends Engineer, structural: Not on file    Marital Status: Never married  Catering manager Violence: Not on file   Social History   Tobacco Use  Smoking Status Never  Smokeless Tobacco Never   Social History   Substance and Sexual Activity  Alcohol Use Yes   Comment: occasionally    Family History  Problem Relation Age of Onset   Diabetes Mother    Asthma Sister    Breast cancer Maternal Aunt    Heart disease Maternal Aunt    Stroke Maternal Grandmother    Heart disease Maternal Grandmother    Asthma Brother    CAD Other      ROS: Denies fever, fatigue, unexplained weight loss/gain, hearing or vision changes, cardiac or respiratory complaints. Denies neurological deficits, musculoskeletal complaints, gastrointestinal or  genitourinary complaints, mental health complaints, and skin changes.   Objective:   There were no vitals filed for this visit.  GENERAL APPEARANCE: Well-appearing, in NAD. Well nourished.  SKIN: Pink, warm and dry. Turgor normal. No rash, lesion, ulceration, or ecchymoses. Hair evenly distributed.  HEENT: HEAD: Normocephalic.  EYES: PERRLA. EOMI. Lids intact w/o defect. Sclera white, Conjunctiva pink w/o exudate.  EARS: External ear w/o  redness, swelling, masses or lesions. EAC clear. TM's intact, translucent w/o bulging, appropriate landmarks visualized. Appropriate acuity to conversational tones.  NOSE: Septum midline w/o deformity. Nares patent, mucosa pink and non-inflamed w/o drainage. No sinus tenderness.  THROAT: Uvula midline. Oropharynx clear. Tonsils non-inflamed w/o exudate ***. Oral mucosa pink and moist.  NECK: Supple, Trachea midline. Full ROM w/o pain or tenderness. No lymphadenopathy. Thyroid non-tender w/o enlargement or palpable masses.  BREASTS: Breasts pendulous, symmetrical, and w/o palpable masses. Nipples everted and w/o discharge. No rash or skin retraction. No axillary or supraclavicular lymphadenopathy.  RESPIRATORY: Chest wall symmetrical w/o masses. Respirations even and non-labored. Breath sounds clear to auscultation bilaterally. No wheezes, rales, rhonchi, or crackles. CARDIAC: S1, S2 present, regular rate and rhythm. No gallops, murmurs, rubs, or clicks. No carotid bruits. Capillary refill <2 seconds. Peripheral pulses 2+ bilaterally. GI: Abdomen soft w/o distention. Normoactive bowel sounds. No palpable masses or tenderness. No guarding or rebound tenderness. Liver and spleen w/o tenderness or enlargement. No CVA tenderness.  MSK: Muscle tone and strength appropriate for age, w/o atrophy or abnormal movement.  EXTREMITIES: Active ROM intact, w/o tenderness, crepitus, or contracture. No obvious joint deformities or effusions. No clubbing, edema, or cyanosis.   NEUROLOGIC: CN's II-XII intact. Motor strength symmetrical with no obvious weakness. No sensory deficits. DTR's 2+ symmetric bilaterally. Steady, even gait.  PSYCH/MENTAL STATUS: Alert, oriented x 3. Cooperative, appropriate mood and affect.    Results for orders placed or performed in visit on 05/23/23  Cytology - PAP( [place])  Result Value Ref Range   High risk HPV Negative    Adequacy      Satisfactory but limited for evaluation with partially obscuring blood;   Adequacy transformation zone component present.    Diagnosis (A)     - Atypical squamous cells of undetermined significance (ASC-US)   Comment Normal Reference Range HPV - Negative     Assessment & Plan:  There are no diagnoses linked to this encounter.  No orders of the defined types were placed in this encounter.   PATIENT COUNSELING:  - Encouraged a healthy well-balanced diet. Patient may adjust caloric intake to maintain or achieve ideal body weight. May reduce intake of dietary saturated fat and total fat and have adequate dietary potassium and calcium preferably from fresh fruits, vegetables, and low-fat dairy products.   - Advised to avoid cigarette smoking. - Discussed with the patient that most people either abstain from alcohol or drink within safe limits (<=14/week and <=4 drinks/occasion for males, <=7/weeks and <= 3 drinks/occasion for females) and that the risk for alcohol disorders and other health effects rises proportionally with the number of drinks per week and how often a drinker exceeds daily limits. - Discussed cessation/primary prevention of drug use and availability of treatment for abuse.  - Discussed sexually transmitted diseases, avoidance of unintended pregnancy and contraceptive alternatives.  - Stressed the importance of regular exercise - Injury prevention: Discussed safety belts, safety helmets, smoke detector, smoking near bedding or upholstery.  - Dental health: Discussed importance  of regular tooth brushing, flossing, and dental visits.   NEXT PREVENTATIVE PHYSICAL DUE IN 1 YEAR.  No follow-ups on file.  Salvatore Decent, FNP

## 2023-07-03 ENCOUNTER — Encounter: Payer: Self-pay | Admitting: Internal Medicine

## 2023-07-03 ENCOUNTER — Ambulatory Visit (INDEPENDENT_AMBULATORY_CARE_PROVIDER_SITE_OTHER): Payer: Commercial Managed Care - HMO | Admitting: Internal Medicine

## 2023-07-03 VITALS — BP 102/74 | HR 78 | Temp 98.3°F | Ht 67.0 in | Wt 222.4 lb

## 2023-07-03 DIAGNOSIS — Z0000 Encounter for general adult medical examination without abnormal findings: Secondary | ICD-10-CM

## 2023-07-03 DIAGNOSIS — E669 Obesity, unspecified: Secondary | ICD-10-CM | POA: Diagnosis not present

## 2023-07-03 DIAGNOSIS — Z1159 Encounter for screening for other viral diseases: Secondary | ICD-10-CM

## 2023-07-03 DIAGNOSIS — Z23 Encounter for immunization: Secondary | ICD-10-CM

## 2023-07-03 LAB — CBC WITH DIFFERENTIAL/PLATELET
Basophils Absolute: 0 10*3/uL (ref 0.0–0.1)
Basophils Relative: 0.5 % (ref 0.0–3.0)
Eosinophils Absolute: 0.1 10*3/uL (ref 0.0–0.7)
Eosinophils Relative: 1.5 % (ref 0.0–5.0)
HCT: 37.5 % (ref 36.0–46.0)
Hemoglobin: 12 g/dL (ref 12.0–15.0)
Lymphocytes Relative: 34 % (ref 12.0–46.0)
Lymphs Abs: 1.9 10*3/uL (ref 0.7–4.0)
MCHC: 31.9 g/dL (ref 30.0–36.0)
MCV: 79 fL (ref 78.0–100.0)
Monocytes Absolute: 0.4 10*3/uL (ref 0.1–1.0)
Monocytes Relative: 7.8 % (ref 3.0–12.0)
Neutro Abs: 3.1 10*3/uL (ref 1.4–7.7)
Neutrophils Relative %: 56.2 % (ref 43.0–77.0)
Platelets: 357 10*3/uL (ref 150.0–400.0)
RBC: 4.74 Mil/uL (ref 3.87–5.11)
RDW: 15.4 % (ref 11.5–15.5)
WBC: 5.5 10*3/uL (ref 4.0–10.5)

## 2023-07-03 LAB — COMPREHENSIVE METABOLIC PANEL
ALT: 8 U/L (ref 0–35)
AST: 10 U/L (ref 0–37)
Albumin: 4.2 g/dL (ref 3.5–5.2)
Alkaline Phosphatase: 71 U/L (ref 39–117)
BUN: 7 mg/dL (ref 6–23)
CO2: 25 meq/L (ref 19–32)
Calcium: 9.4 mg/dL (ref 8.4–10.5)
Chloride: 105 meq/L (ref 96–112)
Creatinine, Ser: 0.68 mg/dL (ref 0.40–1.20)
GFR: 111.6 mL/min (ref >60.00)
Glucose, Bld: 107 mg/dL — ABNORMAL HIGH (ref 70–99)
Potassium: 4.4 meq/L (ref 3.5–5.1)
Sodium: 137 meq/L (ref 135–145)
Total Bilirubin: 0.4 mg/dL (ref 0.2–1.2)
Total Protein: 7.2 g/dL (ref 6.0–8.3)

## 2023-07-03 LAB — LIPID PANEL
Cholesterol: 176 mg/dL (ref 0–200)
HDL: 52.3 mg/dL (ref >39.00)
LDL Cholesterol: 113 mg/dL — ABNORMAL HIGH (ref 0–99)
NonHDL: 123.33
Total CHOL/HDL Ratio: 3
Triglycerides: 54 mg/dL (ref 0.0–149.0)
VLDL: 10.8 mg/dL (ref 0.0–40.0)

## 2023-07-03 LAB — TSH: TSH: 1.72 u[IU]/mL (ref 0.35–5.50)

## 2023-07-04 LAB — HEPATITIS C ANTIBODY: Hepatitis C Ab: NONREACTIVE

## 2023-08-01 ENCOUNTER — Ambulatory Visit (INDEPENDENT_AMBULATORY_CARE_PROVIDER_SITE_OTHER): Payer: Managed Care, Other (non HMO) | Admitting: Physician Assistant

## 2023-08-01 ENCOUNTER — Encounter (INDEPENDENT_AMBULATORY_CARE_PROVIDER_SITE_OTHER): Payer: Self-pay | Admitting: Physician Assistant

## 2023-08-01 VITALS — BP 119/78 | HR 88 | Temp 98.3°F | Ht 66.5 in | Wt 219.0 lb

## 2023-08-01 DIAGNOSIS — Z6834 Body mass index (BMI) 34.0-34.9, adult: Secondary | ICD-10-CM | POA: Diagnosis not present

## 2023-08-01 DIAGNOSIS — I300 Acute nonspecific idiopathic pericarditis: Secondary | ICD-10-CM

## 2023-08-01 DIAGNOSIS — E66811 Obesity, class 1: Secondary | ICD-10-CM | POA: Diagnosis not present

## 2023-08-01 DIAGNOSIS — I471 Supraventricular tachycardia, unspecified: Secondary | ICD-10-CM | POA: Diagnosis not present

## 2023-08-01 DIAGNOSIS — R7309 Other abnormal glucose: Secondary | ICD-10-CM | POA: Insufficient documentation

## 2023-08-01 DIAGNOSIS — Z0289 Encounter for other administrative examinations: Secondary | ICD-10-CM

## 2023-08-01 NOTE — Progress Notes (Signed)
Office: [phone]  /  Fax: [phone]   Initial Visit  Robin Humphrey was seen in clinic today to evaluate for obesity. She is interested in losing weight to improve overall health and reduce the risk of weight related complications. She presents today to review program treatment options, initial physical assessment, and evaluation.     She was referred by: PCP  When asked what else they would like to accomplish? She states: Adopt healthier eating patterns, Improve energy levels and physical activity, Improve existing medical conditions, Improve quality of life, Improve appearance, Improve self-confidence, and Lose a target amount of weight : 185 lb  Weight history: Gained weight following treatment with Depo provera and has not been able to loose since that time.   When asked how has your weight affected you? She states: Has affected self-esteem, Contributed to medical problems, and Problems with eating patterns  Some associated conditions: PCOS, Heart disease, and Other: PSVT history  Contributing factors: Family history, Nutritional, Medications, Reduced physical activity, Eating patterns, Hx of pregnancies, and Other: Treatment with DepoProvera  Weight promoting medications identified: Beta-blockers and Contraceptives or hormonal therapy  Current nutrition plan: None  Current level of physical activity: Walking  Current or previous pharmacotherapy: None  Response to medication: Never tried medications   Past medical history includes:   Past Medical History:  Diagnosis Date   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  Peak Weight:226 lbs , Body Fat%:39.3%, Visceral Fat Rating:9, Weight trend over the last 12 months: Increasing  General:  Alert, oriented and cooperative. Patient is in no acute distress.  Respiratory: Normal respiratory effort, no problems with respiration noted   Gait: able to ambulate independently  Mental Status: Normal mood and affect. Normal behavior. Normal judgment and thought content.   DIAGNOSTIC DATA REVIEWED:  BMET    Component Value Date/Time   NA 137 07/03/2023 0903   NA 139 09/04/2019 1431   K 4.4 07/03/2023 0903   CL 105 07/03/2023 0903   CO2 25 07/03/2023 0903   GLUCOSE 107 (H) 07/03/2023 0903   BUN 7 07/03/2023 0903   BUN 12 09/04/2019 1431   CREATININE 0.68 07/03/2023 0903   CALCIUM 9.4 07/03/2023 0903   GFRNONAA 109 09/04/2019 1431   GFRAA 125 09/04/2019 1431   No results found for: "HGBA1C" No results found for: "INSULIN" CBC    Component Value Date/Time   WBC 5.5 07/03/2023 0903   RBC 4.74 07/03/2023 0903   HGB 12.0 07/03/2023 0903   HGB 12.5 11/16/2020 0920   HCT 37.5 07/03/2023 0903   HCT 38.3 11/16/2020 0920   PLT 357.0 07/03/2023 0903   PLT 344 11/16/2020 0920   MCV 79.0 07/03/2023 0903   MCV 82 11/16/2020 0920   MCH 26.7 11/16/2020 0920   MCH 27.9 08/29/2019 1305   MCHC 31.9 07/03/2023 0903   RDW 15.4 07/03/2023 0903   RDW 13.5 11/16/2020 0920   Iron/TIBC/Ferritin/ %Sat No results found for: "IRON", "TIBC", "FERRITIN", "IRONPCTSAT" Lipid Panel     Component  Value Date/Time   CHOL 176 07/03/2023 0903   TRIG 54.0 07/03/2023 0903   HDL 52.30 07/03/2023 0903   CHOLHDL 3 07/03/2023 0903   VLDL 10.8 07/03/2023 0903   LDLCALC 113 (H) 07/03/2023 0903   Hepatic Function Panel     Component Value Date/Time   PROT 7.2 07/03/2023 0903   ALBUMIN 4.2 07/03/2023 0903   AST 10 07/03/2023 0903   ALT 8 07/03/2023 0903   ALKPHOS 71 07/03/2023 0903   BILITOT 0.4 07/03/2023 0903      Component Value Date/Time    TSH 1.72 07/03/2023 0903     Assessment and Plan:   Idiopathic pericarditis, unspecified chronicity  PSVT (paroxysmal supraventricular tachycardia) (HCC)  Class 1 obesity due to excess calories with serious comorbidity and body mass index (BMI) of 34.0 to 34.9 in adult        Obesity Treatment / Action Plan:  Patient will work on garnering support from family and friends to begin weight loss journey. Will work on eliminating or reducing the presence of highly palatable, calorie dense foods in the home. Will complete provided nutritional and psychosocial assessment questionnaire before the next appointment. Will be scheduled for indirect calorimetry to determine resting energy expenditure in a fasting state.  This will allow [place] to create a reduced calorie, high-protein meal plan to promote loss of fat mass while preserving muscle mass. Will avoid skipping meals which may result in increased hunger signals and overeating at certain times. Counseled on the health benefits of losing 5%-15% of total body weight. Was counseled on nutritional approaches to weight loss and benefits of reducing processed foods and consuming plant-based foods and high quality protein as part of nutritional weight management. Was counseled on pharmacotherapy and role as an adjunct in weight management.   Obesity Education Performed Today:  She was weighed on the bioimpedance scale and results were discussed and documented in the synopsis.  We discussed obesity as a disease and the importance of a more detailed evaluation of all the factors contributing to the disease.  We discussed the importance of long term lifestyle changes which include nutrition, exercise and behavioral modifications as well as the importance of customizing this to her specific health and social needs.  We discussed the benefits of reaching a healthier weight to alleviate the symptoms of existing conditions and reduce the risks of the  biomechanical, metabolic and psychological effects of obesity.  Robin Humphrey appears to be in the action stage of change and states they are ready to start intensive lifestyle modifications and behavioral modifications.  30 minutes was spent today on this visit including the above counseling, pre-visit chart review, and post-visit documentation.  Reviewed by clinician on day of visit: allergies, medications, problem list, medical history, surgical history, family history, social history, and previous encounter notes pertinent to obesity diagnosis.   Lamine Laton,PA-C

## 2023-09-04 ENCOUNTER — Encounter (INDEPENDENT_AMBULATORY_CARE_PROVIDER_SITE_OTHER): Payer: Self-pay | Admitting: Family Medicine

## 2023-09-04 ENCOUNTER — Ambulatory Visit (INDEPENDENT_AMBULATORY_CARE_PROVIDER_SITE_OTHER): Payer: Commercial Managed Care - HMO | Admitting: Family Medicine

## 2023-09-04 ENCOUNTER — Ambulatory Visit (HOSPITAL_COMMUNITY)
Admission: RE | Admit: 2023-09-04 | Discharge: 2023-09-04 | Disposition: A | Discharge status: Home / Self Care | Payer: Commercial Managed Care - HMO | Source: Ambulatory Visit | Attending: Family Medicine | Admitting: Family Medicine

## 2023-09-04 ENCOUNTER — Other Ambulatory Visit: Payer: Self-pay

## 2023-09-04 VITALS — BP 109/70 | HR 74 | Temp 98.8°F | Ht 66.5 in | Wt 217.0 lb

## 2023-09-04 DIAGNOSIS — Z1331 Encounter for screening for depression: Secondary | ICD-10-CM

## 2023-09-04 DIAGNOSIS — R5383 Other fatigue: Secondary | ICD-10-CM | POA: Diagnosis not present

## 2023-09-04 DIAGNOSIS — F419 Anxiety disorder, unspecified: Secondary | ICD-10-CM | POA: Diagnosis not present

## 2023-09-04 DIAGNOSIS — R0602 Shortness of breath: Secondary | ICD-10-CM | POA: Diagnosis not present

## 2023-09-04 DIAGNOSIS — I471 Supraventricular tachycardia, unspecified: Secondary | ICD-10-CM | POA: Diagnosis not present

## 2023-09-04 DIAGNOSIS — Z6834 Body mass index (BMI) 34.0-34.9, adult: Secondary | ICD-10-CM | POA: Diagnosis not present

## 2023-09-04 DIAGNOSIS — E66811 Obesity, class 1: Secondary | ICD-10-CM

## 2023-09-04 NOTE — Progress Notes (Signed)
Carlye Grippe, D.O.  [facility], [facility] Specializing in Clinical Bariatric Medicine Office located at: [address] [place], [place]  [address]   Bariatric Medicine Visit  Dear Salvatore Decent, FNP   Thank you for referring Robin Humphrey to our clinic today for evaluation.  We performed a consultation to discuss her options for treatment and educate the patient on her disease state.  The following note includes my evaluation and treatment recommendations.   Please do not hesitate to reach out to me directly if you have any further concerns.   Assessment and Plan:   FOR THE DISEASE OF OBESITY:  Recommended Dietary Goals Mehek is currently in the action stage of change. As such, her goal is to continue weight management plan.  She has agreed to: follow the Category 2 plan with L options and only 100 snack calories.    Behavioral Intervention We discussed the following today: beginning to work on maintaining a reduced calorie state, getting the recommended amount of protein, incorporating whole foods, making healthy choices, staying well hydrated and practicing mindfulness when eating.  Additional resources provided today: handout on CAT 2 meal plan and Handout on CAT 1-2 lunch options  Evidence-based interventions for health behavior change were utilized today including the discussion of self monitoring techniques, problem-solving barriers and SMART goal setting techniques.   Regarding patient's less desirable eating habits and patterns, we employed the technique of small changes.   Pt will specifically work on: n/a    Recommended Physical Activity Goals Mekiah has been advised to work up to 150 minutes of moderate intensity aerobic activity a week and strengthening exercises 2-3 times per week for cardiovascular health, weight loss maintenance and preservation of muscle mass.   She has agreed to : Continue current level of physical activity    Pharmacotherapy We both agreed to  : begin nutritional and behavioral strategies   FOR ASSOCIATED CONDITIONS ADDRESSED TODAY:  Fatigue Assessment & Plan: Josilynn does feel that her weight is causing her energy to be lower than it should be. Fatigue may be related to obesity, depression or many other causes. she does not appear to have any red flag symptoms and this appears to most likely be related to her current lifestyle habits and dietary intake.  Labs will be ordered and reviewed with her at their next office visit in two weeks.  Epworth sleepiness scale is 4 and appears to be within normal limits.    Norena denies daytime somnolence and admits to waking up still tired. Soleia generally gets 5 hours of sleep per night, and states that she has generally restful sleep. Snoring is present. Apneic episodes is not present.   Discussed the importance of getting 7-9 hrs of sleep for the best weight loss results and overall wellbeing.   ECG: Performed and reviewed/ interpreted independently.  Normal sinus rhythm, rate 73 bpm; reassuring without any acute abnormalities, will continue to monitor for symptoms    Shortness of breath on exertion Assessment & Plan: Tassia does feel that she gets out of breath more easily than she used to when she exercises and seems to be worsening over time with weight gain.  This has gotten worse recently. Isabella denies shortness of breath at rest or orthopnea. Naylin's shortness of breath appears to be obesity related and exercise induced, as they do not appear to have any "red flag" symptoms/ concerns today.  Also, this condition appears to be related to a state of poor cardiovascular conditioning  Obtain labs today and will be reviewed with her at their next office visit in two weeks.  Indirect Calorimeter completed today to help guide our dietary regimen. It shows a VO2 of 247 and a REE of 1699.  Her calculated basal metabolic rate is 1610 thus her measured basal metabolic rate is worse than  expected.  Patient agreed to work on weight loss at this time.  As Jacqueli progresses through our weight loss program, we will gradually increase exercise as tolerated to treat her current condition.   If Averleigh follows our recommendations and loses 5-10% of their weight without improvement of her shortness of breath or if at any time, symptoms become more concerning, they agree to urgently follow up with their PCP/ specialist for further consideration/ evaluation.   Briane verbalizes agreement with this plan.    Anxious mood - emotional eating Assessment & Plan: Pt states she developed anxiety once her heart palpitations began. Prior to this, she reports having no hx of anxiety.   She declines a referral to Dr.Barker today.  Reminded patient of the importance of beginning their prudent nutrition plan and how food can affect mood as well to support emotional wellbeing.    Depression screen Assessment & Plan: Yudelka had a negative depression screening of 3. No concerns in this regard.   PSVT (paroxysmal supraventricular tachycardia) (HCC) Assessment & Plan: Prior cardiology notes and imaging were reviewed with patient today. She states that her heart palpitations started in 2020. She was seen in the ER on 08/10/2019 and 08/18/2019 and has been managed by Dr.Hilty since then. Additionally, she had a Complete ECHO on 09/02/19 that was normal with LVEF 60-65%, normal structure and function otherwise. She is on Metropolol 25 mg daily and sees Dr.Hilty once a year.   Continue with Metoprolol and all treatment per Dr.Hilty. Pt reminded that she is due for 1-year follow up with cardiologist.    Orders: -     VITAMIN D 25 Hydroxy (Vit-D Deficiency, Fractures) -     Folate -     Hemoglobin A1c -     Insulin, random -     T4, free -     Vitamin B12   Obesity (BMI 30.0-34.9) Morbid obesity (HCC)- starting bmi 09-04-23 34.5 Assessment & Plan: See obesity treatment note.    FOLLOW UP:   Follow up  in 2 weeks. She was informed of the importance of frequent follow up visits to maximize her success with intensive lifestyle modifications for her multiple health conditions.  Dyllan Shamis is aware that we will review all of her lab results at our next visit.  She is aware that if anything is critical/ life threatening with the results, we will be contacting her via MyChart prior to the office visit to discuss management.    Chief Complaint:   OBESITY Beckett Vecchiarelli (MR# [id number]) is a pleasant 37 y.o. female who presents for evaluation and treatment of obesity and related comorbidities. Current BMI is Body mass index is 34.5 kg/m. Sara Gautreaux has been struggling with her weight for many years and has been unsuccessful in either losing weight, maintaining weight loss, or reaching her healthy weight goal.  Blasa Ruediger is currently in the action stage of change and ready to dedicate time achieving and maintaining a healthier weight. Airyana Hargus is interested in becoming our patient and working on intensive lifestyle modifications including (but not limited to) diet and exercise for weight loss.  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 . Patient is single and has 2 children. She lives with her 18 y.o [place].   Pt states that in the past, she used alcohol and marijuana during social occasions. She reports never experiencing negative consequences from using these substances.   Contributing factors to weight gain: Family history, Nutritional, Medications, Reduced physical activity, Eating patterns, Hx of pregnancies, and Other: Treatment with DepoProvera   Her heaviest weight is 226 lbs. She desires to be 195 lbs in 7 mos or so.   She does not engage in any formal exercise.   Has tried Intermittent Fasting and Calorie deficit in the past to lose weight. Calorie Deficit worked best for her.   Eats outside the home (fast food or take out) 3 days a week.   Craves pasta and  snacks (e.g chips, cookies, ice-cream).   Usually skips breakfast and lunch.   She states that her family makes eating healthy difficult because she "usually eats whatever my son wants to eat".   Drinks 1-2 diet sodas a day. In the morning, has coffee with creamer, milk, sugar, and sweeteners. Also sometimes drinks juice and tea with sugar. Occasionally drinks tequila or wine.   Worst food habit: snacking.   Subjective:   This is the patient's first visit at [facility].  The patient's NEW PATIENT PACKET that they filled out prior to today's office visit was reviewed at length and information from that paperwork was included within the following office visit note.    Included in the packet: current and past health history, medications, allergies, ROS, gynecologic history (women only), surgical history, family history, social history, weight history, weight loss surgery history (for those that have had weight loss surgery), nutritional evaluation, mood and food questionnaire along with a depression screening (PHQ9) on all patients, an Epworth questionnaire, sleep habits questionnaire, patient life and health improvement goals questionnaire. These will all be scanned into the patient's chart under the "media" tab.   Review of Systems: Please refer to new patient packet scanned into media. Pertinent positives were addressed with patient today.  Reviewed by clinician on day of visit: allergies, medications, problem list, medical history, surgical history, family history, social history, and previous encounter notes.  During the visit, I independently reviewed the patient's EKG, bioimpedance scale results, and indirect calorimeter results. I used this information to tailor a meal plan for the patient that will help Jitzel Proenza to lose weight and will improve her obesity-related conditions going forward.  I performed a medically necessary appropriate examination and/or evaluation. I  discussed the assessment and treatment plan with the patient. The patient was provided an opportunity to ask questions and all were answered. The patient agreed with the plan and demonstrated an understanding of the instructions. Labs were ordered today (unless patient declined them) and will be reviewed with the patient at our next visit unless more critical results need to be addressed immediately. Clinical information was updated and documented in the EMR.   Objective:   PHYSICAL EXAM: Blood pressure 109/70, pulse 74, temperature 98.8 F (37.1 C), height 5' 6.5" (1.689 m), weight 217 lb (98.4 kg), SpO2 98%. Body mass index is 34.5 kg/m.  General: Well Developed, well nourished, and in no acute distress.  HEENT: Normocephalic, atraumatic; EOMI, sclerae are anicteric. Skin: Warm and dry, good turgor Chest:  Normal excursion, shape, no gross ABN Respiratory: No conversational dyspnea; speaking in full sentences NeuroM-Sk:  Normal gross ROM * 4  extremities  Psych: A and O *3, insight adequate, mood- full   Anthropometric Measurements Height: 5' 6.5" (1.689 m) Weight: 217 lb (98.4 kg) BMI (Calculated): 34.5 Weight at Last Visit: na Weight Lost Since Last Visit: na Weight Gained Since Last Visit: na Starting Weight: 217lb Total Weight Loss (lbs): 0 lb (0 kg) Peak Weight: 226lb Waist Measurement : 44 inches   Body Composition  Body Fat %: 38.6 % Fat Mass (lbs): 83.8 lbs Muscle Mass (lbs): 126.4 lbs Total Body Water (lbs): 81.6 lbs Visceral Fat Rating : 8   Other Clinical Data RMR: 1699 Fasting: yes Labs: yes Today's Visit #: 1 Starting Date: 09/04/23 Comments: first visit   DIAGNOSTIC DATA REVIEWED:  BMET    Component Value Date/Time   NA 137 07/03/2023 0903   NA 139 09/04/2019 1431   K 4.4 07/03/2023 0903   CL 105 07/03/2023 0903   CO2 25 07/03/2023 0903   GLUCOSE 107 (H) 07/03/2023 0903   BUN 7 07/03/2023 0903   BUN 12 09/04/2019 1431   CREATININE 0.68  07/03/2023 0903   CALCIUM 9.4 07/03/2023 0903   GFRNONAA 109 09/04/2019 1431   GFRAA 125 09/04/2019 1431   No results found for: "HGBA1C" No results found for: "INSULIN" Lab Results  Component Value Date   TSH 1.72 07/03/2023   CBC    Component Value Date/Time   WBC 5.5 07/03/2023 0903   RBC 4.74 07/03/2023 0903   HGB 12.0 07/03/2023 0903   HGB 12.5 11/16/2020 0920   HCT 37.5 07/03/2023 0903   HCT 38.3 11/16/2020 0920   PLT 357.0 07/03/2023 0903   PLT 344 11/16/2020 0920   MCV 79.0 07/03/2023 0903   MCV 82 11/16/2020 0920   MCH 26.7 11/16/2020 0920   MCH 27.9 08/29/2019 1305   MCHC 31.9 07/03/2023 0903   RDW 15.4 07/03/2023 0903   RDW 13.5 11/16/2020 0920   Iron Studies No results found for: "IRON", "TIBC", "FERRITIN", "IRONPCTSAT" Lipid Panel     Component Value Date/Time   CHOL 176 07/03/2023 0903   TRIG 54.0 07/03/2023 0903   HDL 52.30 07/03/2023 0903   CHOLHDL 3 07/03/2023 0903   VLDL 10.8 07/03/2023 0903   LDLCALC 113 (H) 07/03/2023 0903   Hepatic Function Panel     Component Value Date/Time   PROT 7.2 07/03/2023 0903   ALBUMIN 4.2 07/03/2023 0903   AST 10 07/03/2023 0903   ALT 8 07/03/2023 0903   ALKPHOS 71 07/03/2023 0903   BILITOT 0.4 07/03/2023 0903      Component Value Date/Time   TSH 1.72 07/03/2023 7829   Nutritional No results found for: "VD25OH"  Attestation Statements:   I, Special Puri, acting as a Stage manager for [employer], DO., have compiled all relevant documentation for today's office visit on behalf of Thomasene Lot, DO, while in the presence of [employer], DO.  Reviewed by clinician on day of visit: allergies, medications, problem list, medical history, surgical history, family history, social history, and previous encounter notes pertinent to patient's obesity diagnosis. I have spent 74 minutes in the care of the patient today including: preparing to see patient (e.g. review and interpretation of tests, old notes  ), obtaining and/or reviewing separately obtained history, performing a medically appropriate examination or evaluation, counseling and educating the patient, ordering medications, test or procedures, documenting clinical information in the electronic or other health care record, and independently interpreting results and communicating results to the patient, family, or caregiver   I have  reviewed the above documentation for accuracy and completeness, and I agree with the above. Carlye Grippe, D.O.  The 21st Century Cures Act was signed into law in 2016 which includes the topic of electronic health records.  This provides immediate access to information in MyChart.  This includes consultation notes, operative notes, office notes, lab results and pathology reports.  If you have any questions about what you read please let us know at your next visit so we can discuss your concerns and take corrective action if need be.  We are right here with you.

## 2023-09-05 LAB — VITAMIN B12: Vitamin B-12: 350 pg/mL (ref 232–1245)

## 2023-09-05 LAB — HEMOGLOBIN A1C
Est. average glucose Bld gHb Est-mCnc: 126 mg/dL
Hgb A1c MFr Bld: 6 % — ABNORMAL HIGH (ref 4.8–5.6)

## 2023-09-05 LAB — T4, FREE: Free T4: 1.03 ng/dL (ref 0.82–1.77)

## 2023-09-05 LAB — INSULIN, RANDOM: INSULIN: 18.9 u[IU]/mL (ref 2.6–24.9)

## 2023-09-05 LAB — FOLATE: Folate: 7.2 ng/mL (ref >3.0)

## 2023-09-05 LAB — VITAMIN D 25 HYDROXY (VIT D DEFICIENCY, FRACTURES): Vit D, 25-Hydroxy: 8.5 ng/mL — ABNORMAL LOW (ref 30.0–100.0)

## 2023-09-18 ENCOUNTER — Ambulatory Visit (INDEPENDENT_AMBULATORY_CARE_PROVIDER_SITE_OTHER): Payer: Commercial Managed Care - HMO | Admitting: Family Medicine

## 2023-10-15 ENCOUNTER — Ambulatory Visit (INDEPENDENT_AMBULATORY_CARE_PROVIDER_SITE_OTHER): Payer: Self-pay | Admitting: Family Medicine

## 2023-10-15 ENCOUNTER — Encounter (INDEPENDENT_AMBULATORY_CARE_PROVIDER_SITE_OTHER): Payer: Self-pay | Admitting: Family Medicine

## 2023-10-15 VITALS — BP 123/78 | HR 87 | Temp 97.7°F | Ht 66.5 in | Wt 213.0 lb

## 2023-10-15 DIAGNOSIS — R7303 Prediabetes: Secondary | ICD-10-CM | POA: Diagnosis not present

## 2023-10-15 DIAGNOSIS — F419 Anxiety disorder, unspecified: Secondary | ICD-10-CM

## 2023-10-15 DIAGNOSIS — E7841 Elevated Lipoprotein(a): Secondary | ICD-10-CM | POA: Diagnosis not present

## 2023-10-15 DIAGNOSIS — E669 Obesity, unspecified: Secondary | ICD-10-CM

## 2023-10-15 DIAGNOSIS — F5089 Other specified eating disorder: Secondary | ICD-10-CM

## 2023-10-15 DIAGNOSIS — E538 Deficiency of other specified B group vitamins: Secondary | ICD-10-CM

## 2023-10-15 DIAGNOSIS — E559 Vitamin D deficiency, unspecified: Secondary | ICD-10-CM | POA: Diagnosis not present

## 2023-10-15 MED ORDER — CYANOCOBALAMIN 500 MCG PO TABS: 500.0000 ug | ORAL_TABLET | Freq: Every day | ORAL | 0 refills | Status: DC

## 2023-10-15 MED ORDER — VITAMIN D (ERGOCALCIFEROL) 1.25 MG (50000 UNIT) PO CAPS: 50000.0000 [IU] | ORAL_CAPSULE | ORAL | 0 refills | Status: DC

## 2023-10-15 NOTE — Progress Notes (Signed)
 Robin Humphrey, D.O.  ABFM, ABOM Clinical Bariatric Medicine Physician  Office located at: [address] [place], [place]  [address]     Assessment and Plan:   FOR THE DISEASE OF OBESITY: Morbid obesity (HCC)- starting bmi 09-04-23 34.5 Obesity (BMI 30-39.9) -- Current BMI 33.87 Assessment & Plan: Since last office visit on 09/04/23 patient's muscle mass has decreased by 2.4 lb. Fat mass has decreased by 1.2lb. Total body water has increased by 3lb.  Counseling done on how various foods will affect these numbers and how to maximize success  Total lbs lost to date: 4 lbs  Total weight loss percentage to date: -1.84%   Recommended Dietary Goals Robin Humphrey is currently in the action stage of change. As such, her goal is to continue weight management plan. She has agreed to: follow the Category 2 plan - 1200 kcal per day with B/L options and 100 snack calories.   Behavioral Intervention We discussed the following Behavioral Modification Strategies today: increasing lean protein intake to established goals, decreasing simple carbohydrates , increasing vegetables, increasing lower glycemic fruits, increasing fiber rich foods, avoiding skipping meals, increasing water intake , reading food labels , decreasing eating out or consumption of processed foods, and making healthy choices when eating convenient foods, work on managing stress, creating time for self-care and relaxation, avoiding temptations and identifying enticing environmental cues, and continue to work on maintaining a reduced calorie state, getting the recommended amount of protein, incorporating whole foods, making healthy choices, staying well hydrated and practicing mindfulness when eating.  Additional resources provided today: Category 2 lunch options, Prediabetes and Insulin  Resistance handout, Sources of protein and complex carbs, and multiple recipes   Evidence-based interventions for health behavior change were utilized  today including the discussion of self monitoring techniques, problem-solving barriers and SMART goal setting techniques.   Regarding patient's less desirable eating habits and patterns, we employed the technique of small changes.   Pt will specifically work on: Eat on plan and increase exercise for next visit.   Recommended Physical Activity Goals Robin Humphrey has been advised to work up to 150 minutes of moderate intensity aerobic activity a week and strengthening exercises 2-3 times per week for cardiovascular health, weight loss maintenance and preservation of muscle mass.   She has agreed to :  Increase physical activity in their day and reduce sedentary time (increase NEAT).   Pharmacotherapy We discussed various medication options to help Robin Humphrey with her weight loss efforts and we both agreed to : continue with nutritional and behavioral strategies   FOR ASSOCIATED CONDITIONS ADDRESSED TODAY: Prediabetes - new Assessment & Plan: Lab Results  Component Value Date   HGBA1C 6.0 (H) 09/04/2023   INSULIN  18.9 09/04/2023   Robin Humphrey's latest Hgb A1C and insulin  as of 09/04/23 were 6.0 and 18.9 respectively. Per past labs she has been in prediabetic range at least 5-6 years. Positive family history of diabetes (mom and grandma). She has been working on meeting her protein goals daily. Reports some off-plan eating and skipped meals over the holidays.    I counseled patient on pathophysiology of the disease Pre-DM. Educated pt that meeting her protein goal, cutting simple carbs/sugars, increasing exercise, and will help stabilize her sugars and decrease hunger/cravings. Education given on reading nutritional labels. Reviewed alternative options to high protein sources. Robin Humphrey will continue to work on weight loss, exercise, via her meal plan we devised to help decrease the risk of progressing to diabetes. Stressed importance of dietary and  lifestyle modifications to result in weight loss as first line  treatment and treating with medications may be added to her regimen in the future if needed.    Elevated lipoprotein(a) Assessment & Plan: Lab Results  Component Value Date   CHOL 176 07/03/2023   HDL 52.30 07/03/2023   LDLCALC 113 (H) 07/03/2023   TRIG 54.0 07/03/2023   CHOLHDL 3 07/03/2023   Last LDL as of 07/03/23 was above goal. Also reviewed TSH, CMP, FLP, and CBC. Liver enzymes and TSH, both are within normal limits. Not currently treating with any medications. Diet controlled.   I stressed the importance that patient continue with our prudent nutritional plan that is low in saturated and trans fats, and low in fatty carbs to improve these numbers. We extensively discussed several lifestyle modifications today and she will continue to work on diet, exercise and weight loss efforts.   Vitamin D  deficiency- new Assessment & Plan: Lab Results  Component Value Date   VD25OH 8.5 (L) 09/04/2023   Last vitamin D  was significantly low at 8.5. Not currently on any supplementations. Low vitamin D  levels increase the risk of depression, low energy, and increased risk for breast cancer and colon cancer.   I recommend she start vitamin D  supplementation, we mutually agreed to start her on ERGO. We will continue to monitor and recheck vitamin D  levels in 3-4 months from last obtained.   Orders: - Will start ERGO 50K units once weekly.    B12 nutritional deficiency Assessment & Plan: Lab Results  Component Value Date   VITAMINB12 350 09/04/2023    B12 below goal, at 350. She is not currently taking any B12 supplements. Ideal B12 goal reviewed with pt. I recommend pt start daily B12 supplementation, pt agreed. We will continue to monitor and recheck vitamin B12 levels in 3-4 months from last obtained.   Orders: - Start B12 500 mcg OTC.    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 Discussed possibility of pt referral to Dr. Sharron, pt declined for now. Encouraged her to manage anxiety with daily meditation (4-7-8 breathing method). Recommended a 10-15 minute distraction activity that she loves, is physical, and that takes focus/attention.    FOLLOW UP:   Return in about 4 weeks (around 11/12/2023). She was informed of the importance of frequent follow up visits to maximize her success with intensive lifestyle modifications for her multiple health conditions.  Subjective:   Chief complaint: Obesity Robin Humphrey is here to discuss her progress with her obesity treatment plan. She is on the Category 2 Plan with lunch options and 100 snack calories and states she is following her eating plan approximately 0 % of the time. She states she is not exercising.  Interval History:  Robin Humphrey is here today for her first follow-up office visit since starting the program with us .  Since last office visit she is down 4 lbs. Is sometimes eating on-plan, ate off-plan over the holidays. Tried to avoid sweets as much as she could. Has been working on eating 3 meals a day but reports still skipping meals (skipped breakfast today). Today she ate a lean pork chop sandwich (pork tenderloin, Light bread, a little mustard). Reports eating high protein oatmeal in the mornings and uses lactose milk.   All blood work/ lab tests that were recently ordered by myself or an outside provider were reviewed  with patient today per their request. Extended time was spent counseling her on all new disease processes that were discovered or preexisting ones that are affected by BMI.  she understands that many of these abnormalities will need to monitored regularly along with the current treatment plan of prudent dietary changes, in which we are making each and every office visit, to improve these health parameters.  Pharmacotherapy for weight loss: She is currently taking medications  for  medical weight loss.  Denies side effects.    Review of Systems:  Pertinent positives were addressed with patient today.  Reviewed by clinician on day of visit: allergies, medications, problem list, medical history, surgical history, family history, social history, and previous encounter notes.   Weight Summary and Biometrics   Weight Lost Since Last Visit: 4 lb  Weight Gained Since Last Visit: 0 lb   Vitals Temp: 97.7 F (36.5 C) BP: 123/78 Pulse Rate: 87 SpO2: 98 %   Anthropometric Measurements Height: 5' 6.5 (1.689 m) Weight: 213 lb (96.6 kg) BMI (Calculated): 33.87 Weight at Last Visit: 217 lb Weight Lost Since Last Visit: 4 lb Weight Gained Since Last Visit: 0 lb Starting Weight: 217 lb Total Weight Loss (lbs): 4 lb (1.814 kg) Peak Weight: 226 lb   Body Composition  Body Fat %: 38.7 % Fat Mass (lbs): 82.6 lbs Muscle Mass (lbs): 124 lbs Total Body Water (lbs): 84.6 lbs Visceral Fat Rating : 8   Other Clinical Data RMR: [id number] Fasting: No Labs: No Today's Visit #: 2 Starting Date: 09/04/23   Objective:   PHYSICAL EXAM:  Blood pressure 123/78, pulse 87, temperature 97.7 F (36.5 C), height 5' 6.5 (1.689 m), weight 213 lb (96.6 kg), last menstrual period 10/08/2023, SpO2 98%. Body mass index is 33.86 kg/m.  General: she is overweight, cooperative and in no acute distress.   HEENT: EOMI, sclerae are anicteric. Lungs: Normal breathing effort, no conversational dyspnea. M-Sk:  Normal gross ROM * 4 extremities  PSYCH: Has normal mood, affect and thought process. Neurologic: No gross sensory or motor deficits. Well developed, A and O * 3  DIAGNOSTIC DATA REVIEWED:  BMET    Component Value Date/Time   NA 137 07/03/2023 0903   NA 139 09/04/2019 1431   K 4.4 07/03/2023 0903   CL 105 07/03/2023 0903   CO2 25 07/03/2023 0903   GLUCOSE 107 (H) 07/03/2023 0903   BUN 7 07/03/2023 0903   BUN 12 09/04/2019 1431   CREATININE 0.68 07/03/2023 0903    CALCIUM 9.4 07/03/2023 0903   GFRNONAA 109 09/04/2019 1431   GFRAA 125 09/04/2019 1431   Lab Results  Component Value Date   HGBA1C 6.0 (H) 09/04/2023   Lab Results  Component Value Date   INSULIN  18.9 09/04/2023   Lab Results  Component Value Date   TSH 1.72 07/03/2023   CBC    Component Value Date/Time   WBC 5.5 07/03/2023 0903   RBC 4.74 07/03/2023 0903   HGB 12.0 07/03/2023 0903   HGB 12.5 11/16/2020 0920   HCT 37.5 07/03/2023 0903   HCT 38.3 11/16/2020 0920   PLT 357.0 07/03/2023 0903   PLT 344 11/16/2020 0920   MCV 79.0 07/03/2023 0903   MCV 82 11/16/2020 0920   MCH 26.7 11/16/2020 0920   MCH 27.9 08/29/2019 1305   MCHC 31.9 07/03/2023 0903   RDW 15.4 07/03/2023 0903   RDW 13.5 11/16/2020 0920   Iron Studies No results found for: IRON, TIBC, FERRITIN, IRONPCTSAT Lipid Panel  Component Value Date/Time   CHOL 176 07/03/2023 0903   TRIG 54.0 07/03/2023 0903   HDL 52.30 07/03/2023 0903   CHOLHDL 3 07/03/2023 0903   VLDL 10.8 07/03/2023 0903   LDLCALC 113 (H) 07/03/2023 0903   Hepatic Function Panel     Component Value Date/Time   PROT 7.2 07/03/2023 0903   ALBUMIN 4.2 07/03/2023 0903   AST 10 07/03/2023 0903   ALT 8 07/03/2023 0903   ALKPHOS 71 07/03/2023 0903   BILITOT 0.4 07/03/2023 0903      Component Value Date/Time   TSH 1.72 07/03/2023 9096   Nutritional Lab Results  Component Value Date   VD25OH 8.5 (L) 09/04/2023    Attestations:   Reviewed by clinician on day of visit: allergies, medications, problem list, medical history, surgical history, family history, social history, and previous encounter notes pertinent to patient's obesity diagnosis.   I have spent 55 minutes in the care of the patient today including: preparing to see patient (e.g. review and interpretation of tests, old notes ), obtaining and/or reviewing separately obtained history, performing a medically appropriate examination or evaluation, counseling and  educating the patient, ordering medications, test or procedures, documenting clinical information in the electronic or other health care record, and independently interpreting results and communicating results to the patient, family, or caregiver   I, Robin Humphrey, acting as a medical scribe for Robin Jenkins, DO., have compiled all relevant documentation for today's office visit on behalf of Robin Jenkins, DO, while in the presence of [employer]cl[employer], DO.  I have reviewed the above documentation for accuracy and completeness, and I agree with the above. Robin Humphrey, D.O.  The 21st Century Cures Act was signed into law in 2016 which includes the topic of electronic health records.  This provides immediate access to information in MyChart.  This includes consultation notes, operative notes, office notes, lab results and pathology reports.  If you have any questions about what you read please let us  know at your next visit so we can discuss your concerns and take corrective action if need be.  We are right here with you.

## 2023-11-13 ENCOUNTER — Encounter (INDEPENDENT_AMBULATORY_CARE_PROVIDER_SITE_OTHER): Payer: Self-pay | Admitting: Family Medicine

## 2023-11-13 ENCOUNTER — Ambulatory Visit (INDEPENDENT_AMBULATORY_CARE_PROVIDER_SITE_OTHER): Payer: No Typology Code available for payment source | Admitting: Family Medicine

## 2023-11-13 VITALS — BP 111/71 | HR 79 | Temp 98.5°F | Ht 66.5 in | Wt 215.0 lb

## 2023-11-13 DIAGNOSIS — E559 Vitamin D deficiency, unspecified: Secondary | ICD-10-CM

## 2023-11-13 DIAGNOSIS — R7303 Prediabetes: Secondary | ICD-10-CM | POA: Diagnosis not present

## 2023-11-13 DIAGNOSIS — E538 Deficiency of other specified B group vitamins: Secondary | ICD-10-CM | POA: Diagnosis not present

## 2023-11-13 DIAGNOSIS — Z6834 Body mass index (BMI) 34.0-34.9, adult: Secondary | ICD-10-CM

## 2023-11-13 DIAGNOSIS — E669 Obesity, unspecified: Secondary | ICD-10-CM

## 2023-11-13 MED ORDER — CYANOCOBALAMIN 500 MCG PO TABS
500.0000 ug | ORAL_TABLET | Freq: Every day | ORAL | 0 refills | Status: DC
Start: 2023-11-13 — End: 2024-03-26

## 2023-11-13 MED ORDER — VITAMIN D (ERGOCALCIFEROL) 1.25 MG (50000 UNIT) PO CAPS
50000.0000 [IU] | ORAL_CAPSULE | ORAL | 0 refills | Status: DC
Start: 2023-11-13 — End: 2023-12-13

## 2023-11-13 NOTE — Progress Notes (Signed)
 Robin Humphrey, D.O.  ABFM, ABOM Specializing in Clinical Bariatric Medicine  Office located at: [address] [place], [place]  [address]   Assessment and Plan:   FOR THE DISEASE OF OBESITY: Morbid obesity (HCC)- starting bmi 09-04-23 34.5 Obesity (BMI 30-39.9) -- Current BMI 34.19 Assessment & Plan: Since last office visit on 10/15/23 patient's muscle mass has increased by 2 lb. Fat mass has decreased by 0.2 lb. Total body water has not changed.  Counseling done on how various foods will affect these numbers and how to maximize success  Total lbs lost to date: 2 lbs Total weight loss percentage to date: -0.92%    Recommended Dietary Goals Robin Humphrey is currently in the action stage of change. As such, her goal is to continue weight management plan.  She has agreed to: continue current plan   Behavioral Intervention We discussed the following today: increasing lean protein intake to established goals, decreasing simple carbohydrates , avoiding skipping meals, increasing water intake , work on meal planning and preparation, keeping healthy foods at home, practice mindfulness eating and understand the difference between hunger signals and cravings, avoiding temptations and identifying enticing environmental cues, and continue to work on maintaining a reduced calorie state, getting the recommended amount of protein, incorporating whole foods, making healthy choices, staying well hydrated and practicing mindfulness when eating.  Additional resources provided today: None  Evidence-based interventions for health behavior change were utilized today including the discussion of self monitoring techniques, problem-solving barriers and SMART goal setting techniques.   Regarding patient's less desirable eating habits and patterns, we employed the technique of small changes.   Pt will specifically work on: avoid skipping breakfast for next visit.    Recommended Physical Activity  Goals Robin Humphrey has been advised to work up to 150 minutes of moderate intensity aerobic activity a week and strengthening exercises 2-3 times per week for cardiovascular health, weight loss maintenance and preservation of muscle mass.   She has agreed to :  Continue current level of physical activity , Think about enjoyable ways to increase daily physical activity and overcoming barriers to exercise, and Increase physical activity in their day and reduce sedentary time (increase NEAT).   Pharmacotherapy We both agreed to: continue with nutritional and behavioral strategies   FOR ASSOCIATED CONDITIONS ADDRESSED TODAY: Prediabetes- new Assessment & Plan: Lab Results  Component Value Date   HGBA1C 6.0 (H) 09/04/2023   INSULIN 18.9 09/04/2023    Last A1C and insulin was 6.0 and 18.9 respectively as of 09/03/24. No meds currently. Diet/exercise approach. Pt still having carb cravings. Has not been drinking sufficient amounts of water daily, only drinks 1 bottle per day.   Encouraged pt to prioritize protein intake as this helps improve muscle mass, better control with hunger/cravings, and with stabilizing sugars. Reviewed the importance not skipping meals with pt. Advised she increase her water intake to drinking at least half her body weight in ounces of water per day. Continue with current regimen. Will continue to monitor her condition. No changes made today.    B12 nutritional deficiency Assessment & Plan: Lab Results  Component Value Date   VITAMINB12 350 09/04/2023   She was started on supplementation on 10/15/23. Pt is compliant with her OTC B12 500 mcg once daily. Tolerating well, no SE.   Continue with B12 supplementation, no changes made today. Reviewed benefits of at goal B12 including improvements with mood and energy. Will continue to monitor.   Orders: - Refill B12,  no dose changes.    Vitamin D deficiency- new Assessment & Plan: Lab Results  Component Value Date    VD25OH 8.5 (L) 09/04/2023   Last vitamin D in 09/2023 was well below goal at 8.5. She was started on ERGO at her last visit on 10/15/23. Pt is currently compliant with ERGO 50K units once weekly. Tolerating well with no adverse SE. States her energy levels have improved.  Continue with current supplementation as prescribed. Will continue to monitor her condition and recheck her vitamin D in 1-2 months.   Orders: - Refill ERGO, no dose changes.    Follow up:   Return in about 2 weeks (around 11/27/2023). She was informed of the importance of frequent follow up visits to maximize her success with intensive lifestyle modifications for her multiple health conditions.  Subjective:   Chief complaint: Obesity Robin Humphrey is here to discuss her progress with her obesity treatment plan. She is on the Category 2 Plan with 100 snack calories B/L options and states she is following her eating plan approximately 60% of the time. She states she is walking 1 mile 7 days per week.  Interval History:  Robin Humphrey is here for a follow up office visit. Since last OV on 10/15/23, she is up 2 lbs. She has been meal planning for 2-3 days at a time. Is eating out about twice a week and adds she previously would eat out more frequently than that. She is still not much of a breakfast person and struggles with this meal the most. During the day is when she tries to eat yogurt to make up for her protein. Drinks about 1 bottle of water per day, acknowledging this is not enough.   Pharmacotherapy for weight loss: She is currently taking no anti-obesity medication.   Review of Systems:  Pertinent positives were addressed with patient today.  Reviewed by clinician on day of visit: allergies, medications, problem list, medical history, surgical history, family history, social history, and previous encounter notes.  Weight Summary and Biometrics   Weight Lost Since Last Visit: 0  Weight Gained Since Last Visit: 2     Vitals Temp: 98.5 F (36.9 C) BP: 111/71 Pulse Rate: 79 SpO2: 99 %   Anthropometric Measurements Height: 5' 6.5" (1.689 m) Weight: 215 lb (97.5 kg) BMI (Calculated): 34.19 Weight at Last Visit: 213 lb Weight Lost Since Last Visit: 0 Weight Gained Since Last Visit: 2 Starting Weight: 217 lb Total Weight Loss (lbs): 2 lb (0.907 kg) Peak Weight: 226 lb   Body Composition  Body Fat %: 38.3 % Fat Mass (lbs): 82.4 lbs Muscle Mass (lbs): 126 lbs Total Body Water (lbs): 84.6 lbs Visceral Fat Rating : 8   Other Clinical Data Fasting: no Labs: no Today's Visit #: 3 Starting Date: 09/04/23    Objective:   PHYSICAL EXAM: Blood pressure 111/71, pulse 79, temperature 98.5 F (36.9 C), height 5' 6.5" (1.689 m), weight 215 lb (97.5 kg), last menstrual period 10/08/2023, SpO2 99%. Body mass index is 34.18 kg/m.  General: she is overweight, cooperative and in no acute distress. PSYCH: Has normal mood, affect and thought process.   HEENT: EOMI, sclerae are anicteric. Lungs: Normal breathing effort, no conversational dyspnea. Extremities: Moves * 4 Neurologic: A and O * 3, good insight  DIAGNOSTIC DATA REVIEWED: BMET    Component Value Date/Time   NA 137 07/03/2023 0903   NA 139 09/04/2019 1431   K 4.4 07/03/2023 0903   CL 105 07/03/2023 0903  CO2 25 07/03/2023 0903   GLUCOSE 107 (H) 07/03/2023 0903   BUN 7 07/03/2023 0903   BUN 12 09/04/2019 1431   CREATININE 0.68 07/03/2023 0903   CALCIUM 9.4 07/03/2023 0903   GFRNONAA 109 09/04/2019 1431   GFRAA 125 09/04/2019 1431   Lab Results  Component Value Date   HGBA1C 6.0 (H) 09/04/2023   Lab Results  Component Value Date   INSULIN 18.9 09/04/2023   Lab Results  Component Value Date   TSH 1.72 07/03/2023   CBC    Component Value Date/Time   WBC 5.5 07/03/2023 0903   RBC 4.74 07/03/2023 0903   HGB 12.0 07/03/2023 0903   HGB 12.5 11/16/2020 0920   HCT 37.5 07/03/2023 0903   HCT 38.3 11/16/2020 0920    PLT 357.0 07/03/2023 0903   PLT 344 11/16/2020 0920   MCV 79.0 07/03/2023 0903   MCV 82 11/16/2020 0920   MCH 26.7 11/16/2020 0920   MCH 27.9 08/29/2019 1305   MCHC 31.9 07/03/2023 0903   RDW 15.4 07/03/2023 0903   RDW 13.5 11/16/2020 0920   Iron Studies No results found for: "IRON", "TIBC", "FERRITIN", "IRONPCTSAT" Lipid Panel     Component Value Date/Time   CHOL 176 07/03/2023 0903   TRIG 54.0 07/03/2023 0903   HDL 52.30 07/03/2023 0903   CHOLHDL 3 07/03/2023 0903   VLDL 10.8 07/03/2023 0903   LDLCALC 113 (H) 07/03/2023 0903   Hepatic Function Panel     Component Value Date/Time   PROT 7.2 07/03/2023 0903   ALBUMIN 4.2 07/03/2023 0903   AST 10 07/03/2023 0903   ALT 8 07/03/2023 0903   ALKPHOS 71 07/03/2023 0903   BILITOT 0.4 07/03/2023 0903      Component Value Date/Time   TSH 1.72 07/03/2023 3875   Nutritional Lab Results  Component Value Date   VD25OH 8.5 (L) 09/04/2023    Attestations:   Burnett Sheng, acting as a medical scribe for Thomasene Lot, DO., have compiled all relevant documentation for today's office visit on behalf of Thomasene Lot, DO, while in the presence of [employer], DO.  Reviewed by clinician on day of visit: allergies, medications, problem list, medical history, surgical history, family history, social history, and previous encounter notes pertinent to patient's obesity diagnosis.  I have reviewed the above documentation for accuracy and completeness, and I agree with the above. Robin Humphrey, D.O.  The 21st Century Cures Act was signed into law in 2016 which includes the topic of electronic health records.  This provides immediate access to information in MyChart.  This includes consultation notes, operative notes, office notes, lab results and pathology reports.  If you have any questions about what you read please let us know at your next visit so we can discuss your concerns and take corrective action if need be.  We are  right here with you.

## 2023-12-13 ENCOUNTER — Encounter (INDEPENDENT_AMBULATORY_CARE_PROVIDER_SITE_OTHER): Payer: Self-pay | Admitting: Family Medicine

## 2023-12-13 ENCOUNTER — Ambulatory Visit (INDEPENDENT_AMBULATORY_CARE_PROVIDER_SITE_OTHER): Payer: No Typology Code available for payment source | Admitting: Family Medicine

## 2023-12-13 VITALS — BP 112/68 | HR 61 | Temp 98.0°F | Ht 66.5 in | Wt 214.0 lb

## 2023-12-13 DIAGNOSIS — Z6834 Body mass index (BMI) 34.0-34.9, adult: Secondary | ICD-10-CM

## 2023-12-13 DIAGNOSIS — E669 Obesity, unspecified: Secondary | ICD-10-CM

## 2023-12-13 DIAGNOSIS — E538 Deficiency of other specified B group vitamins: Secondary | ICD-10-CM

## 2023-12-13 DIAGNOSIS — R7303 Prediabetes: Secondary | ICD-10-CM

## 2023-12-13 DIAGNOSIS — E559 Vitamin D deficiency, unspecified: Secondary | ICD-10-CM

## 2023-12-13 MED ORDER — VITAMIN D (ERGOCALCIFEROL) 1.25 MG (50000 UNIT) PO CAPS: 50000.0000 [IU] | ORAL_CAPSULE | ORAL | 1 refills | Status: DC

## 2023-12-13 NOTE — Progress Notes (Signed)
 Robin Humphrey, D.O.  ABFM, ABOM Specializing in Clinical Bariatric Medicine  Office located at: [address] [place], [place]  [address]   Assessment and Plan:   RECHECK B12, Vitamin D, and A1c next OV  FOR THE DISEASE OF OBESITY:  Obesity (BMI 30-39.9) -- Current BMI 34.03 Morbid obesity (HCC)- starting bmi 09-04-23 34.5 Assessment & Plan: Since last office visit on 11/13/2023 patient's  Muscle mass has decreased by 3.6 lb. Fat mass has increased by 3.2 lb. Total body water has increased by 3 lb.  Counseling done on how various foods will affect these numbers and how to maximize success  Total lbs lost to date: 3 lbs  Total weight loss percentage to date: 1.38%   Recommended Dietary Goals Claudean is currently in the action stage of change. As such, her goal is to continue weight management plan.  She has agreed to: continue current plan   Behavioral Intervention We discussed the following today: increasing lean protein intake to established goals and work on managing stress through exercise, creating time for self-care and relaxation, using AI or Weight Watchers for recipe ideas.   Additional resources provided today:  handout on CHAT-GPT inspired recipes  Evidence-based interventions for health behavior change were utilized today including the discussion of self monitoring techniques, problem-solving barriers and SMART goal setting techniques.   Regarding patient's less desirable eating habits and patterns, we employed the technique of small changes.   Goal: consider contacting insurance regarding AOM coverage   Recommended Physical Activity Goals Phylicia has been advised to work up to 150 minutes of moderate intensity aerobic activity a week and strengthening exercises 2-3 times per week for cardiovascular health, weight loss maintenance and preservation of muscle mass.   She has agreed to : Think about enjoyable ways to increase daily physical activity and  overcoming barriers to exercise   Pharmacotherapy N/a   FOR ASSOCIATED CONDITIONS ADDRESSED TODAY:   Prediabetes Assessment & Plan: No meds currently. Diet/lifestyle approach. Her hunger and cravings are better controlled when following her PNP. Continue working on nutrition plan to decrease simple carbohydrates, increase lean proteins and exercise to promote weight loss and improve glycemic control and prevent progression to T2DM.   B12 nutritional deficiency Assessment & Plan: Endorses taking a daily OTC B12 but not sure of exact dose. Pt will continue regimen and inform us of dose she's taking next OV. Continue to focus on B12 rich foods.    Vitamin D deficiency Assessment & Plan: Pt is doing well on ERGO 50,000 units every 7 days. No N/V or muscle weakness with Ergocalciferol. Continue regimen. Recheck next OV.   Relevant Orders:  -     Vitamin D (Ergocalciferol); Take 1 capsule (50,000 Units total) by mouth every 7 (seven) days.  Dispense: 4 capsule; Refill: 1   Follow up:   Return 01/16/2024. She was informed of the importance of frequent follow up visits to maximize her success with intensive lifestyle modifications for her multiple health conditions.  Subjective:   Chief complaint: Obesity [name] is here to discuss her progress with her obesity treatment plan. She is on the Category 2 Plan with 100 snack calories B/L option and states she is following her eating plan approximately 0% of the time. She states she is walking 30 minutes 1 day per week.  Interval History:  [name] is here for a follow up office visit. Since last OV on 11/13/2023, [name] is down 1 lb. In the past 2  weeks, [name] acknowledges deviating from her PNP d/t stressful family dynamics. Still sometimes skipping breakfast. Reports that she will get back on her meal plan this Monday (3/17).  Pharmacotherapy for weight loss: She is currently taking no anti-obesity medication.   Review of Systems:   Pertinent positives were addressed with patient today.  Reviewed by clinician on day of visit: allergies, medications, problem list, medical history, surgical history, family history, social history, and previous encounter notes.  Weight Summary and Biometrics   Weight Lost Since Last Visit: 1 lb  Weight Gained Since Last Visit: 0   Vitals Temp: 98 F (36.7 C) BP: 112/68 Pulse Rate: 61 SpO2: 90 %   Anthropometric Measurements Height: 5' 6.5" (1.689 m) Weight: 214 lb (97.1 kg) BMI (Calculated): 34.03 Weight at Last Visit: 215 lb Weight Lost Since Last Visit: 1 lb Weight Gained Since Last Visit: 0 Starting Weight: 217 lb Total Weight Loss (lbs): 3 lb (1.361 kg) Peak Weight: 226 lb   Body Composition  Body Fat %: 39.9 % Fat Mass (lbs): 85.6 lbs Muscle Mass (lbs): 122.4 lbs Total Body Water (lbs): 87.6 lbs Visceral Fat Rating : 9   Other Clinical Data Fasting: no Labs: no Today's Visit #: 37   Objective:   PHYSICAL EXAM: Blood pressure 112/68, pulse 61, temperature 98 F (36.7 C), height 5' 6.5" (1.689 m), weight 214 lb (97.1 kg), SpO2 90%. Body mass index is 34.02 kg/m.  General: she is overweight, cooperative and in no acute distress. PSYCH: Has normal mood, affect and thought process.   HEENT: EOMI, sclerae are anicteric. Lungs: Normal breathing effort, no conversational dyspnea. Extremities: Moves * 4 Neurologic: A and O * 3, good insight  DIAGNOSTIC DATA REVIEWED: BMET    Component Value Date/Time   NA 137 07/03/2023 0903   NA 139 09/04/2019 1431   K 4.4 07/03/2023 0903   CL 105 07/03/2023 0903   CO2 25 07/03/2023 0903   GLUCOSE 107 (H) 07/03/2023 0903   BUN 7 07/03/2023 0903   BUN 12 09/04/2019 1431   CREATININE 0.68 07/03/2023 0903   CALCIUM 9.4 07/03/2023 0903   GFRNONAA 109 09/04/2019 1431   GFRAA 125 09/04/2019 1431   Lab Results  Component Value Date   HGBA1C 6.0 (H) 09/04/2023   Lab Results  Component Value Date   INSULIN  18.9 09/04/2023   Lab Results  Component Value Date   TSH 1.72 07/03/2023   CBC    Component Value Date/Time   WBC 5.5 07/03/2023 0903   RBC 4.74 07/03/2023 0903   HGB 12.0 07/03/2023 0903   HGB 12.5 11/16/2020 0920   HCT 37.5 07/03/2023 0903   HCT 38.3 11/16/2020 0920   PLT 357.0 07/03/2023 0903   PLT 344 11/16/2020 0920   MCV 79.0 07/03/2023 0903   MCV 82 11/16/2020 0920   MCH 26.7 11/16/2020 0920   MCH 27.9 08/29/2019 1305   MCHC 31.9 07/03/2023 0903   RDW 15.4 07/03/2023 0903   RDW 13.5 11/16/2020 0920   Iron Studies No results found for: "IRON", "TIBC", "FERRITIN", "IRONPCTSAT" Lipid Panel     Component Value Date/Time   CHOL 176 07/03/2023 0903   TRIG 54.0 07/03/2023 0903   HDL 52.30 07/03/2023 0903   CHOLHDL 3 07/03/2023 0903   VLDL 10.8 07/03/2023 0903   LDLCALC 113 (H) 07/03/2023 0903   Hepatic Function Panel     Component Value Date/Time   PROT 7.2 07/03/2023 0903   ALBUMIN 4.2 07/03/2023 0903   AST  10 07/03/2023 0903   ALT 8 07/03/2023 0903   ALKPHOS 71 07/03/2023 0903   BILITOT 0.4 07/03/2023 0903      Component Value Date/Time   TSH 1.72 07/03/2023 4782   Nutritional Lab Results  Component Value Date   VD25OH 8.5 (L) 09/04/2023    Attestations:   I, Special Puri, acting as a Stage manager for Thomasene Lot, DO., have compiled all relevant documentation for today's office visit on behalf of Thomasene Lot, DO, while in the presence of [employer], DO.  I have reviewed the above documentation for accuracy and completeness, and I agree with the above. Robin Humphrey, D.O.  The 21st Century Cures Act was signed into law in 2016 which includes the topic of electronic health records.  This provides immediate access to information in MyChart.  This includes consultation notes, operative notes, office notes, lab results and pathology reports.  If you have any questions about what you read please let us know at your next visit so we can  discuss your concerns and take corrective action if need be.  We are right here with you.

## 2024-01-16 ENCOUNTER — Ambulatory Visit (INDEPENDENT_AMBULATORY_CARE_PROVIDER_SITE_OTHER): Admitting: Family Medicine

## 2024-02-19 ENCOUNTER — Encounter (INDEPENDENT_AMBULATORY_CARE_PROVIDER_SITE_OTHER): Payer: Self-pay | Admitting: Family Medicine

## 2024-02-19 ENCOUNTER — Ambulatory Visit (INDEPENDENT_AMBULATORY_CARE_PROVIDER_SITE_OTHER): Admitting: Family Medicine

## 2024-02-19 VITALS — BP 119/75 | HR 87 | Temp 98.7°F | Ht 66.5 in | Wt 213.0 lb

## 2024-02-19 DIAGNOSIS — E669 Obesity, unspecified: Secondary | ICD-10-CM

## 2024-02-19 DIAGNOSIS — E559 Vitamin D deficiency, unspecified: Secondary | ICD-10-CM | POA: Diagnosis not present

## 2024-02-19 DIAGNOSIS — E538 Deficiency of other specified B group vitamins: Secondary | ICD-10-CM | POA: Diagnosis not present

## 2024-02-19 DIAGNOSIS — R7303 Prediabetes: Secondary | ICD-10-CM | POA: Diagnosis not present

## 2024-02-19 DIAGNOSIS — E7841 Elevated Lipoprotein(a): Secondary | ICD-10-CM

## 2024-02-19 DIAGNOSIS — Z6833 Body mass index (BMI) 33.0-33.9, adult: Secondary | ICD-10-CM

## 2024-02-19 MED ORDER — METFORMIN HCL 500 MG PO TABS: ORAL_TABLET | ORAL | 0 refills | Status: DC

## 2024-02-19 MED ORDER — VITAMIN D (ERGOCALCIFEROL) 1.25 MG (50000 UNIT) PO CAPS
50000.0000 [IU] | ORAL_CAPSULE | ORAL | 0 refills | Status: DC
Start: 2024-02-19 — End: 2024-03-26

## 2024-02-19 NOTE — Progress Notes (Signed)
 Robin Humphrey, D.O.  ABFM, ABOM Specializing in Clinical Bariatric Medicine  Office located at: [address] [place], [place]  [address]   Assessment and Plan:   Orders Placed This Encounter  Procedures   VITAMIN D  25 Hydroxy (Vit-D Deficiency, Fractures)   Vitamin B12   Hemoglobin A1c   Comprehensive metabolic panel with GFR   Medications Discontinued During This Encounter  Medication Reason   Vitamin D , Ergocalciferol , (DRISDOL ) 1.25 MG (50000 UNIT) CAPS capsule Reorder    Meds ordered this encounter  Medications   Vitamin D , Ergocalciferol , (DRISDOL ) 1.25 MG (50000 UNIT) CAPS capsule    Sig: Take 1 capsule (50,000 Units total) by mouth every 7 (seven) days.    Dispense:  4 capsule    Refill:  0   metFORMIN  (GLUCOPHAGE ) 500 MG tablet    Sig: 1 po with lunch and dinner daily    Dispense:  60 tablet    Refill:  0    30 d supply;  ** OV for RF **   Do not send RF request    FOR THE DISEASE OF OBESITY:  Obesity (BMI 30-39.9) -- Current BMI 33.87 Morbid obesity (HCC)- starting bmi 09-04-23 34.5 Assessment & Plan: Since last office visit on 12/13/2023 patient's  Muscle mass has increased by 2.2 lb. Fat mass has decreased by 3.6 lb. Total body water has decreased by 5.6 lb.  Counseling done on how various foods will affect these numbers and how to maximize success  Total lbs lost to date: 4 lbs  Total weight loss percentage to date: 1.84%    Recommended Dietary Goals [name] is currently in the action stage of change. As such, her goal is to continue weight management plan.  She has agreed to: continue current plan   Behavioral Intervention We discussed the following today: increasing lean protein intake to established goals  Additional resources provided today: Handout on risks/ benefits of metformin  and associated Myths of use  Evidence-based interventions for health behavior change were utilized today including the discussion of self monitoring techniques,  problem-solving barriers and SMART goal setting techniques.   Regarding patient's less desirable eating habits and patterns, we employed the technique of small changes.   Pt will specifically work on measuring her protein intake more often.    Recommended Physical Activity Goals [name] has been advised to work up to 300-450 minutes of moderate intensity aerobic activity a week and strengthening exercises 2-3 times per week for cardiovascular health, weight loss maintenance and preservation of muscle mass.   She has agreed to : Think about enjoyable ways to increase daily physical activity and overcoming barriers to exercise   Pharmacotherapy See Pre-DM note.    ASSOCIATED CONDITIONS ADDRESSED TODAY:  Prediabetes Assessment & Plan: Most recent A1c and fasting insulin : Lab Results  Component Value Date   HGBA1C 6.0 (H) 09/04/2023   INSULIN  18.9 09/04/2023   Lab Results  Component Value Date   CREATININE 0.68 07/03/2023   BUN 7 07/03/2023   NA 137 07/03/2023   K 4.4 07/03/2023   CL 105 07/03/2023   CO2 25 07/03/2023   Diet/lifestyle approach. Is experiencing increased hunger and cravings for carbohydrates at times. Reviewed most recent renal parameters which are within acceptable ranges.  After providing educational handouts and discussing benefits , alternative treatment options and side effects patient will be STARTED on Metformin  and will up-titrate to 500 mg twice daily as tolerated. Continue medically supervised weight management plan; pt will specifically  work on measuring and increasing her lean protein intake.    Vitamin D  deficiency Assessment & Plan: Most recent VD: Lab Results  Component Value Date   VD25OH 8.5 (L) 09/04/2023   Currently on ergocalciferol  50,000 units wkly without any adverse effects such as nausea, vomiting or muscle weakness. Continue supplement. Recheck levels today.    B12 nutritional deficiency Assessment & Plan: Most recent B12: Lab  Results  Component Value Date   VITAMINB12 350 09/04/2023   Currently on OTC B12 500 mcg daily. Continue supplement and nutrient rich diet. Recheck levels today.   Follow up:   Return 03/26/2024 at 12:20 PM. She was informed of the importance of frequent follow up visits to maximize her success with intensive lifestyle modifications for her multiple health conditions.  Chanika Byland is aware that we will review all of her lab results at our next visit together in person.  She is aware that if anything is critical/ life threatening with the results, we will be contacting her via MyChart or by my CMA will be calling them prior to the office visit to discuss acute management.   Subjective:   Chief complaint: Obesity Robin Humphrey is here to discuss her progress with her obesity treatment plan. She is on the Category 2 Plan with 100 snack calories B/L option and states she is following her eating plan approximately 20% of the time. She states she is  not exercising.   Interval History:  [name] is here for a follow up office visit. Since last OV on 12/13/2023, Robin Humphrey is down 1 lb. States her life has been very busy since LOV. 1 month ago, she had all 4 wisdom teeth removed. 2 weeks ago she really started following her meal plan and focusing on her protein intake. Not measuring her proteins though.   Pharmacotherapy for weight loss: n/a  Review of Systems:  Pertinent positives were addressed with patient today.  Reviewed by clinician on day of visit: allergies, medications, problem list, medical history, surgical history, family history, social history, and previous encounter notes.  Weight Summary and Biometrics   Weight Lost Since Last Visit: 1lb  Weight Gained Since Last Visit: 0lb  Vitals Temp: 98.7 F (37.1 C) BP: 119/75 Pulse Rate: 87 SpO2: 99 %   Anthropometric Measurements Height: 5' 6.5" (1.689 m) Weight: 213 lb (96.6 kg) BMI (Calculated): 33.87 Weight at Last Visit:  214lb Weight Lost Since Last Visit: 1lb Weight Gained Since Last Visit: 0lb Starting Weight: 217lb Total Weight Loss (lbs): 4 lb (1.814 kg)   Body Composition  Body Fat %: 38.5 % Fat Mass (lbs): 82 lbs Muscle Mass (lbs): 124.6 lbs Total Body Water (lbs): 82 lbs Visceral Fat Rating : 8   Other Clinical Data Fasting: No Labs: Yes Today's Visit #: 5 Starting Date: 09/04/23   Objective:   PHYSICAL EXAM: Blood pressure 119/75, pulse 87, temperature 98.7 F (37.1 C), height 5' 6.5" (1.689 m), weight 213 lb (96.6 kg), SpO2 99%. Body mass index is 33.86 kg/m.  General: she is overweight, cooperative and in no acute distress. PSYCH: Has normal mood, affect and thought process.   HEENT: EOMI, sclerae are anicteric. Lungs: Normal breathing effort, no conversational dyspnea. Extremities: Moves * 4 Neurologic: A and O * 3, good insight  DIAGNOSTIC DATA REVIEWED: BMET    Component Value Date/Time   NA 137 07/03/2023 0903   NA 139 09/04/2019 1431   K 4.4 07/03/2023 0903   CL 105 07/03/2023 0903   CO2  25 07/03/2023 0903   GLUCOSE 107 (H) 07/03/2023 0903   BUN 7 07/03/2023 0903   BUN 12 09/04/2019 1431   CREATININE 0.68 07/03/2023 0903   CALCIUM 9.4 07/03/2023 0903   GFRNONAA 109 09/04/2019 1431   GFRAA 125 09/04/2019 1431   Lab Results  Component Value Date   HGBA1C 6.0 (H) 09/04/2023   Lab Results  Component Value Date   INSULIN  18.9 09/04/2023   Lab Results  Component Value Date   TSH 1.72 07/03/2023   CBC    Component Value Date/Time   WBC 5.5 07/03/2023 0903   RBC 4.74 07/03/2023 0903   HGB 12.0 07/03/2023 0903   HGB 12.5 11/16/2020 0920   HCT 37.5 07/03/2023 0903   HCT 38.3 11/16/2020 0920   PLT 357.0 07/03/2023 0903   PLT 344 11/16/2020 0920   MCV 79.0 07/03/2023 0903   MCV 82 11/16/2020 0920   MCH 26.7 11/16/2020 0920   MCH 27.9 08/29/2019 1305   MCHC 31.9 07/03/2023 0903   RDW 15.4 07/03/2023 0903   RDW 13.5 11/16/2020 0920   Iron  Studies No results found for: "IRON", "TIBC", "FERRITIN", "IRONPCTSAT" Lipid Panel     Component Value Date/Time   CHOL 176 07/03/2023 0903   TRIG 54.0 07/03/2023 0903   HDL 52.30 07/03/2023 0903   CHOLHDL 3 07/03/2023 0903   VLDL 10.8 07/03/2023 0903   LDLCALC 113 (H) 07/03/2023 0903   Hepatic Function Panel     Component Value Date/Time   PROT 7.2 07/03/2023 0903   ALBUMIN 4.2 07/03/2023 0903   AST 10 07/03/2023 0903   ALT 8 07/03/2023 0903   ALKPHOS 71 07/03/2023 0903   BILITOT 0.4 07/03/2023 0903      Component Value Date/Time   TSH 1.72 07/03/2023 1610   Nutritional Lab Results  Component Value Date   VD25OH 8.5 (L) 09/04/2023    Attestations:   I, Special Puri, acting as a Stage manager for Marceil Sensor, DO., have compiled all relevant documentation for today's office visit on behalf of Marceil Sensor, DO, while in the presence of [employer], DO.  I have reviewed the above documentation for accuracy and completeness, and I agree with the above. Robin Humphrey, D.O.  The 21st Century Cures Act was signed into law in 2016 which includes the topic of electronic health records.  This provides immediate access to information in MyChart.  This includes consultation notes, operative notes, office notes, lab results and pathology reports.  If you have any questions about what you read please let us  know at your next visit so we can discuss your concerns and take corrective action if need be.  We are right here with you.

## 2024-02-20 LAB — COMPREHENSIVE METABOLIC PANEL WITH GFR
ALT: 10 IU/L (ref 0–32)
AST: 13 IU/L (ref 0–40)
Albumin: 4.3 g/dL (ref 3.9–4.9)
Alkaline Phosphatase: 80 IU/L (ref 44–121)
BUN/Creatinine Ratio: 13 (ref 9–23)
BUN: 10 mg/dL (ref 6–20)
Bilirubin Total: 0.5 mg/dL (ref 0.0–1.2)
CO2: 19 mmol/L — ABNORMAL LOW (ref 20–29)
Calcium: 9.2 mg/dL (ref 8.7–10.2)
Chloride: 101 mmol/L (ref 96–106)
Creatinine, Ser: 0.77 mg/dL (ref 0.57–1.00)
Globulin, Total: 2.5 g/dL (ref 1.5–4.5)
Glucose: 97 mg/dL (ref 70–99)
Potassium: 4.2 mmol/L (ref 3.5–5.2)
Sodium: 137 mmol/L (ref 134–144)
Total Protein: 6.8 g/dL (ref 6.0–8.5)
eGFR: 102 mL/min/{1.73_m2} (ref >59)

## 2024-02-20 LAB — HEMOGLOBIN A1C
Est. average glucose Bld gHb Est-mCnc: 126 mg/dL
Hgb A1c MFr Bld: 6 % — ABNORMAL HIGH (ref 4.8–5.6)

## 2024-02-20 LAB — VITAMIN B12: Vitamin B-12: 862 pg/mL (ref 232–1245)

## 2024-02-20 LAB — VITAMIN D 25 HYDROXY (VIT D DEFICIENCY, FRACTURES): Vit D, 25-Hydroxy: 50.5 ng/mL (ref 30.0–100.0)

## 2024-03-26 ENCOUNTER — Ambulatory Visit (INDEPENDENT_AMBULATORY_CARE_PROVIDER_SITE_OTHER): Admitting: Family Medicine

## 2024-03-26 ENCOUNTER — Encounter (INDEPENDENT_AMBULATORY_CARE_PROVIDER_SITE_OTHER): Payer: Self-pay | Admitting: Family Medicine

## 2024-03-26 VITALS — BP 123/78 | HR 105 | Temp 97.9°F | Ht 66.5 in | Wt 214.0 lb

## 2024-03-26 DIAGNOSIS — R7303 Prediabetes: Secondary | ICD-10-CM | POA: Diagnosis not present

## 2024-03-26 DIAGNOSIS — E559 Vitamin D deficiency, unspecified: Secondary | ICD-10-CM | POA: Diagnosis not present

## 2024-03-26 DIAGNOSIS — E669 Obesity, unspecified: Secondary | ICD-10-CM

## 2024-03-26 DIAGNOSIS — E538 Deficiency of other specified B group vitamins: Secondary | ICD-10-CM

## 2024-03-26 DIAGNOSIS — Z6834 Body mass index (BMI) 34.0-34.9, adult: Secondary | ICD-10-CM

## 2024-03-26 MED ORDER — METFORMIN HCL 500 MG PO TABS: ORAL_TABLET | ORAL | 0 refills | Status: DC

## 2024-03-26 MED ORDER — CYANOCOBALAMIN 500 MCG PO TABS: 500.0000 ug | ORAL_TABLET | Freq: Every day | ORAL | 0 refills | Status: AC

## 2024-03-26 MED ORDER — VITAMIN D (ERGOCALCIFEROL) 1.25 MG (50000 UNIT) PO CAPS
50000.0000 [IU] | ORAL_CAPSULE | ORAL | 0 refills | Status: DC
Start: 2024-03-26 — End: 2024-05-05

## 2024-03-26 NOTE — Progress Notes (Signed)
 Robin Humphrey, D.O.  ABFM, ABOM Specializing in Clinical Bariatric Medicine  Office located at: [address] [place], [place]  [address]   Assessment and Plan:  No orders of the defined types were placed in this encounter.  Medications Discontinued During This Encounter  Medication Reason   cyanocobalamin  (VITAMIN B12) 500 MCG tablet Reorder   Vitamin D , Ergocalciferol , (DRISDOL ) 1.25 MG (50000 UNIT) CAPS capsule Reorder   metFORMIN  (GLUCOPHAGE ) 500 MG tablet Reorder     Meds ordered this encounter  Medications   Vitamin D , Ergocalciferol , (DRISDOL ) 1.25 MG (50000 UNIT) CAPS capsule    Sig: Take 1 capsule (50,000 Units total) by mouth every 7 (seven) days.    Dispense:  8 capsule    Refill:  0   metFORMIN  (GLUCOPHAGE ) 500 MG tablet    Sig: 1 po with lunch and dinner daily    Dispense:  120 tablet    Refill:  0    60 d supply;  ** OV for RF **   Do not send RF request   cyanocobalamin  (VITAMIN B12) 500 MCG tablet    Sig: Take 1 tablet (500 mcg total) by mouth daily. OTC    Dispense:  30 tablet    Refill:  0      FOR THE DISEASE OF OBESITY: Morbid obesity (HCC)- starting bmi 09-04-23 34.5 Obesity (BMI 30-39.9) -- Current BMI 34.03 Assessment & Plan: Since last office visit on 5/20 patient's muscle mass has increased by 0.2 lbs. Fat mass has increased by 1.2 lbs. Total body water has increased by 4.6 lbs.  Counseling done on how various foods will affect these numbers and how to maximize success.   Total lbs lost to date: 3 lbs Total weight loss percentage to date: -1.38%    Recommended Dietary Goals Robin Humphrey is currently in the action stage of change. As such, her goal is to continue weight management plan.  She has agreed to: continue current plan   Behavioral Intervention We discussed the following today: increasing lean protein intake to established goals, decreasing simple carbohydrates , avoiding skipping meals, increasing water intake , keeping healthy  foods at home, decreasing eating out or consumption of processed foods, and making healthy choices when eating convenient foods, and planning for success. Advised pt to drink at least 6-7 bottles of water per day.   Additional resources provided today: Handout on NEAT strategies   Evidence-based interventions for health behavior change were utilized today including the discussion of self monitoring techniques, problem-solving barriers and SMART goal setting techniques.   Regarding patient's less desirable eating habits and patterns, we employed the technique of small changes.   Pt will specifically work on: increasing NEAT for next visit.    Recommended Physical Activity Goals Robin Humphrey has been advised to work up to 300-450 minutes of moderate intensity aerobic activity a week and strengthening exercises 2-3 times per week for cardiovascular health, weight loss maintenance and preservation of muscle mass.   She has agreed to: Increase physical activity in their day and reduce sedentary time (increase NEAT).   Pharmacotherapy We both agreed to: Continue with current nutritional and behavioral strategies and Metformin  for PreDM management.   ASSOCIATED CONDITIONS ADDRESSED TODAY:  Prediabetes Assessment & Plan: Lab Results  Component Value Date   HGBA1C 6.0 (H) 02/19/2024   HGBA1C 6.0 (H) 09/04/2023   INSULIN  18.9 09/04/2023   Pt was started on Metformin  at LOV on 02/19/24. She is currently taking Metformin  500 mg and  started taking BID 2 weeks ago. She does endorse mild nausea when eating greasy foods, however, she is otherwise tolerating it well. She also admits that she is having more regular bowel movements since starting Metformin . She monitors her sugars at home and notes they have improved since starting Metformin . She notes her sweet cravings are uncontrolled. Reviewed goal A1c of 5.7 or less with pt. Reviewed risks/benefits of Metformin  and how eating off plan may cause potential GI  side effects. Encouraged pt to continue working on implementation of her meal plan and stressed the importance of maintaining healthy lifestyle modifications. Continue with current medication regimen, will refill today.     Vitamin D  deficiency Assessment & Plan: Lab Results  Component Value Date   VD25OH 50.5 02/19/2024   VD25OH 8.5 (L) 09/04/2023   Within 6 months her vit D has significantly improved form 8.5 in 09/2023 to 50.5 on 02/19/24. Tolerating well, no adverse SE. She reports her energy levels have improved since starting vit D. Reviewed ideal vit D levels of 50-70 and how these levels are expected to improve as she loses weight. Continue current supplementation, will refill today. Will continue to monitor these levels in the future and decrease her supplementation dose as needed.     B12 nutritional deficiency Assessment & Plan: Lab Results  Component Value Date   VITAMINB12 862 02/19/2024   VITAMINB12 350 09/04/2023   Her B12 has improved from 350 in 09/2023 to 862 on 02/19/24. Currently on B12 500 mcg once daily. Tolerating well with no side effects. Continue current supplementation, no changes. Will refill vitamin B12.     Follow up:   Return in about 5 weeks (around 04/30/2024) for 5 week follow up . She was informed of the importance of frequent follow up visits to maximize her success with intensive lifestyle modifications for her multiple health conditions.  Subjective:   Chief complaint: Obesity Robin Humphrey is here to discuss her progress with her obesity treatment plan. She is on the Category 2 Plan with 100 snack calories B/L option and states she is following her eating plan approximately 45% of the time. She states she is walking 60 minutes 7 days per week.  Interval History:  Robin Humphrey is here for a follow up office visit. Since last OV on 5/20, she is up 1 lb. Although she has been trying to prioritize her daily protein intake, she has not been measuring her  portions. She does endorse sweet cravings. She also admits to occasionally eating greasy foods since starting Metformin  about 1 month ago. Reports she has been walking about 1 mile to her car when getting to and leaving work.   Pharmacotherapy for weight loss: She is currently taking Metformin  500 mg BID  Review of Systems:  Pertinent positives were addressed with patient today.  Reviewed by clinician on day of visit: allergies, medications, problem list, medical history, surgical history, family history, social history, and previous encounter notes.  Weight Summary and Biometrics   Weight Lost Since Last Visit: 0lb  Weight Gained Since Last Visit: 1lb   Vitals Temp: 97.9 F (36.6 C) BP: 123/78 Pulse Rate: (!) 105 SpO2: 99 %   Anthropometric Measurements Height: 5' 6.5 (1.689 m) Weight: 214 lb (97.1 kg) BMI (Calculated): 34.03 Weight at Last Visit: 213lb Weight Lost Since Last Visit: 0lb Weight Gained Since Last Visit: 1lb Starting Weight: 217lb Total Weight Loss (lbs): 3 lb (1.361 kg)   Body Composition  Body Fat %: 38.8 % Fat Mass (  lbs): 83.2 lbs Muscle Mass (lbs): 124.8 lbs Total Body Water (lbs): 86.6 lbs Visceral Fat Rating : 8   Other Clinical Data Fasting: No Labs: No Today's Visit #: 6 Starting Date: 09/04/23    Objective:   PHYSICAL EXAM: Blood pressure 123/78, pulse (!) 105, temperature 97.9 F (36.6 C), height 5' 6.5 (1.689 m), weight 214 lb (97.1 kg), SpO2 99%. Body mass index is 34.02 kg/m.  General: she is overweight, cooperative and in no acute distress. PSYCH: Has normal mood, affect and thought process.   HEENT: EOMI, sclerae are anicteric. Lungs: Normal breathing effort, no conversational dyspnea. Extremities: Moves * 4 Neurologic: A and O * 3, good insight  DIAGNOSTIC DATA REVIEWED: BMET    Component Value Date/Time   NA 137 02/19/2024 0858   K 4.2 02/19/2024 0858   CL 101 02/19/2024 0858   CO2 19 (L) 02/19/2024 0858    GLUCOSE 97 02/19/2024 0858   GLUCOSE 107 (H) 07/03/2023 0903   BUN 10 02/19/2024 0858   CREATININE 0.77 02/19/2024 0858   CALCIUM 9.2 02/19/2024 0858   GFRNONAA 109 09/04/2019 1431   GFRAA 125 09/04/2019 1431   Lab Results  Component Value Date   HGBA1C 6.0 (H) 02/19/2024   HGBA1C 6.0 (H) 09/04/2023   Lab Results  Component Value Date   INSULIN  18.9 09/04/2023   Lab Results  Component Value Date   TSH 1.72 07/03/2023   CBC    Component Value Date/Time   WBC 5.5 07/03/2023 0903   RBC 4.74 07/03/2023 0903   HGB 12.0 07/03/2023 0903   HGB 12.5 11/16/2020 0920   HCT 37.5 07/03/2023 0903   HCT 38.3 11/16/2020 0920   PLT 357.0 07/03/2023 0903   PLT 344 11/16/2020 0920   MCV 79.0 07/03/2023 0903   MCV 82 11/16/2020 0920   MCH 26.7 11/16/2020 0920   MCH 27.9 08/29/2019 1305   MCHC 31.9 07/03/2023 0903   RDW 15.4 07/03/2023 0903   RDW 13.5 11/16/2020 0920   Iron Studies No results found for: IRON, TIBC, FERRITIN, IRONPCTSAT Lipid Panel     Component Value Date/Time   CHOL 176 07/03/2023 0903   TRIG 54.0 07/03/2023 0903   HDL 52.30 07/03/2023 0903   CHOLHDL 3 07/03/2023 0903   VLDL 10.8 07/03/2023 0903   LDLCALC 113 (H) 07/03/2023 0903   Hepatic Function Panel     Component Value Date/Time   PROT 6.8 02/19/2024 0858   ALBUMIN 4.3 02/19/2024 0858   AST 13 02/19/2024 0858   ALT 10 02/19/2024 0858   ALKPHOS 80 02/19/2024 0858   BILITOT 0.5 02/19/2024 0858      Component Value Date/Time   TSH 1.72 07/03/2023 0903   Nutritional Lab Results  Component Value Date   VD25OH 50.5 02/19/2024   VD25OH 8.5 (L) 09/04/2023    Attestations:   LILLETTE Vernell Forest, acting as a medical scribe for Robin Jenkins, DO., have compiled all relevant documentation for today's office visit on behalf of Robin Jenkins, DO, while in the presence of [employer], DO.  I have reviewed the above documentation for accuracy and completeness, and I agree with the above.  Robin JINNY Humphrey, D.O.  The 21st Century Cures Act was signed into law in 2016 which includes the topic of electronic health records.  This provides immediate access to information in MyChart.  This includes consultation notes, operative notes, office notes, lab results and pathology reports.  If you have any questions about what you read please let  us  know at your next visit so we can discuss your concerns and take corrective action if need be.  We are right here with you.

## 2024-04-30 ENCOUNTER — Ambulatory Visit (INDEPENDENT_AMBULATORY_CARE_PROVIDER_SITE_OTHER): Admitting: Family Medicine

## 2024-05-05 ENCOUNTER — Ambulatory Visit (INDEPENDENT_AMBULATORY_CARE_PROVIDER_SITE_OTHER): Admitting: Family Medicine

## 2024-05-05 ENCOUNTER — Encounter (INDEPENDENT_AMBULATORY_CARE_PROVIDER_SITE_OTHER): Payer: Self-pay | Admitting: Family Medicine

## 2024-05-05 VITALS — BP 119/78 | HR 79 | Temp 98.2°F | Ht 66.5 in | Wt 213.0 lb

## 2024-05-05 DIAGNOSIS — E559 Vitamin D deficiency, unspecified: Secondary | ICD-10-CM | POA: Diagnosis not present

## 2024-05-05 DIAGNOSIS — R7303 Prediabetes: Secondary | ICD-10-CM

## 2024-05-05 DIAGNOSIS — Z6833 Body mass index (BMI) 33.0-33.9, adult: Secondary | ICD-10-CM

## 2024-05-05 MED ORDER — METFORMIN HCL 500 MG PO TABS: ORAL_TABLET | ORAL | 0 refills | Status: DC

## 2024-05-05 MED ORDER — VITAMIN D (ERGOCALCIFEROL) 1.25 MG (50000 UNIT) PO CAPS: 50000.0000 [IU] | ORAL_CAPSULE | ORAL | 0 refills | Status: DC

## 2024-05-05 NOTE — Progress Notes (Signed)
 Robin Humphrey, D.O.  ABFM, ABOM Specializing in Clinical Bariatric Medicine  Office located at: [address] [place], [place]  [address]   Assessment and Plan:   Medications Discontinued During This Encounter  Medication Reason   Vitamin D , Ergocalciferol , (DRISDOL ) 1.25 MG (50000 UNIT) CAPS capsule Reorder   metFORMIN  (GLUCOPHAGE ) 500 MG tablet Reorder     Meds ordered this encounter  Medications   Vitamin D , Ergocalciferol , (DRISDOL ) 1.25 MG (50000 UNIT) CAPS capsule    Sig: Take 1 capsule (50,000 Units total) by mouth every 7 (seven) days.    Dispense:  8 capsule    Refill:  0   metFORMIN  (GLUCOPHAGE ) 500 MG tablet    Sig: 1 po with lunch and dinner daily    Dispense:  120 tablet    Refill:  0    60 d supply;  ** OV for RF **   Do not send RF request     Recheck fasting labs (Hemoglobin A1c and fasting lipid panel) next OV.    FOR THE DISEASE OF OBESITY:  BMI 33.0-33.9,adult - current BMI 33.87 Morbid obesity (HCC)- starting bmi 09-04-23 34.5 Assessment & Plan: Since last office visit on 03/26/2024 patient's muscle mass has decreased by 1.6 lbs. Fat mass has increased by 0.2 lbs. Total body water has decreased by 0.8 lbs.  Body fat % has increased by 0.3 %. Counseling done on how various foods will affect these numbers and how to maximize success  Total lbs lost to date: - 4  lbs Total weight loss percentage to date: -1.84 %   Recommended Dietary Goals Robin Humphrey is currently in the action stage of change. As such, her goal is to continue weight management plan.  She has agreed to: continue current plan   Behavioral Intervention We discussed the following today: protein shakes or drinkable yogurts for breakfast, increasing lean protein intake to established goals, work on meal planning and preparation, and using GPT or another AI platform for recipe ideas- searching low calorie, low carb, high protein chicken recipes etc  Additional resources provided  today: None  Evidence-based interventions for health behavior change were utilized today including the discussion of self monitoring techniques, problem-solving barriers and SMART goal setting techniques.   Regarding patient's less desirable eating habits and patterns, we employed the technique of small changes.   Pt will specifically work on: n/a   Recommended Physical Activity Goals Robin Humphrey has been advised to work up to 300-450 minutes of moderate intensity aerobic activity a week and strengthening exercises 2-3 times per week for cardiovascular health, weight loss maintenance and preservation of muscle mass.   She has agreed to: continue to gradually increase the amount and intensity of exercise routine   Pharmacotherapy See Pre-DM note.    ASSOCIATED CONDITIONS ADDRESSED TODAY:   Prediabetes Assessment & Plan: Lab Results  Component Value Date   HGBA1C 6.0 (H) 02/19/2024   HGBA1C 6.0 (H) 09/04/2023   INSULIN  18.9 09/04/2023    On Metformin  500 mg 1 tablet with lunch and 1 tablet with dinner with reported good compliance and tolerance. She thinks the Metformin  is making her not feel hungry and making it difficult to get in all her lean proteins. Upon interview, pt desires to continue the Metformin  as it is helping abate her hunger and cravings. She agrees to be more intentional about eating regularly and getting in all her meal plan foods. Losing 10% or more of adipose may improve condition. Advance exercise as  able.     Vitamin D  deficiency Assessment & Plan: Lab Results  Component Value Date   VD25OH 50.5 02/19/2024   VD25OH 8.5 (L) 09/04/2023   Currently on Ergocalciferol  50,000 units weekly without any adverse effects such as nausea, vomiting or muscle weakness. No acute concerns.   - Continue Vit D supplementation.  - Recheck as deemed clinically necessary.     Follow up:   No follow-ups on file.  She was informed of the importance of frequent follow up visits  to maximize her success with intensive lifestyle modifications for her multiple health conditions.   Subjective:   Chief complaint: Obesity Robin Humphrey is here to discuss her progress with her obesity treatment plan. She is on the Category 2 Plan with 100 snack calories B/L option. States she is following her eating plan approximately 30-40% of the time. She states she is walking 15-20 minutes 3-4 days per week.   Interval History:  Robin Humphrey is here for a follow up office visit. Since last OV on 03/26/2024 , she is down 1 lb. Her biggest challenge lately has been getting in all her lean proteins. She does endorse also doing more celebration eating.Occasionally she will skip both breakfast and lunch.     Pharmacotherapy that aid with weight loss: She is currently taking Metformin  500 mg 1 tablet with lunch and 1 tablet with dinner.     Review of Systems:  Pertinent positives were addressed with patient today.  Reviewed by clinician on day of visit: allergies, medications, problem list, medical history, surgical history, family history, social history, and previous encounter notes.   Weight Summary and Biometrics   Weight Lost Since Last Visit: 1lb  Weight Gained Since Last Visit: 0    Vitals Temp: 98.2 F (36.8 C) BP: 119/78 Pulse Rate: 79 SpO2: 98 %   Anthropometric Measurements Height: 5' 6.5 (1.689 m) Weight: 213 lb (96.6 kg) BMI (Calculated): 33.87 Weight at Last Visit: 214lb Weight Lost Since Last Visit: 1lb Weight Gained Since Last Visit: 0 Starting Weight: 217lb Total Weight Loss (lbs): 4 lb (1.814 kg)   Body Composition  Body Fat %: 39.1 % Fat Mass (lbs): 83.4 lbs Muscle Mass (lbs): 123.2 lbs Total Body Water (lbs): 85.8 lbs Visceral Fat Rating : 8   Other Clinical Data Fasting: no Labs: no Today's Visit #: 7 Starting Date: 09/04/23   Objective:   PHYSICAL EXAM: Blood pressure 119/78, pulse 79, temperature 98.2 F (36.8 C), height 5' 6.5 (1.689  m), weight 213 lb (96.6 kg), SpO2 98%. Body mass index is 33.86 kg/m.  General: she is overweight, cooperative and in no acute distress. PSYCH: Has normal mood, affect and thought process.   HEENT: EOMI, sclerae are anicteric. Lungs: Normal breathing effort, no conversational dyspnea. Extremities: Moves * 4 Neurologic: A and O * 3, good insight  DIAGNOSTIC DATA REVIEWED: BMET    Component Value Date/Time   NA 137 02/19/2024 0858   K 4.2 02/19/2024 0858   CL 101 02/19/2024 0858   CO2 19 (L) 02/19/2024 0858   GLUCOSE 97 02/19/2024 0858   GLUCOSE 107 (H) 07/03/2023 0903   BUN 10 02/19/2024 0858   CREATININE 0.77 02/19/2024 0858   CALCIUM 9.2 02/19/2024 0858   GFRNONAA 109 09/04/2019 1431   GFRAA 125 09/04/2019 1431   Lab Results  Component Value Date   HGBA1C 6.0 (H) 02/19/2024   HGBA1C 6.0 (H) 09/04/2023   Lab Results  Component Value Date   INSULIN  18.9 09/04/2023  Lab Results  Component Value Date   TSH 1.72 07/03/2023   CBC    Component Value Date/Time   WBC 5.5 07/03/2023 0903   RBC 4.74 07/03/2023 0903   HGB 12.0 07/03/2023 0903   HGB 12.5 11/16/2020 0920   HCT 37.5 07/03/2023 0903   HCT 38.3 11/16/2020 0920   PLT 357.0 07/03/2023 0903   PLT 344 11/16/2020 0920   MCV 79.0 07/03/2023 0903   MCV 82 11/16/2020 0920   MCH 26.7 11/16/2020 0920   MCH 27.9 08/29/2019 1305   MCHC 31.9 07/03/2023 0903   RDW 15.4 07/03/2023 0903   RDW 13.5 11/16/2020 0920   Iron Studies No results found for: IRON, TIBC, FERRITIN, IRONPCTSAT Lipid Panel     Component Value Date/Time   CHOL 176 07/03/2023 0903   TRIG 54.0 07/03/2023 0903   HDL 52.30 07/03/2023 0903   CHOLHDL 3 07/03/2023 0903   VLDL 10.8 07/03/2023 0903   LDLCALC 113 (H) 07/03/2023 0903   Hepatic Function Panel     Component Value Date/Time   PROT 6.8 02/19/2024 0858   ALBUMIN 4.3 02/19/2024 0858   AST 13 02/19/2024 0858   ALT 10 02/19/2024 0858   ALKPHOS 80 02/19/2024 0858   BILITOT 0.5  02/19/2024 0858      Component Value Date/Time   TSH 1.72 07/03/2023 0903   Nutritional Lab Results  Component Value Date   VD25OH 50.5 02/19/2024   VD25OH 8.5 (L) 09/04/2023    Attestations:   I, Robin Humphrey, acting as a Stage manager for Robin Jenkins, DO., have compiled all relevant documentation for today's office visit on behalf of Robin Jenkins, DO, while in the presence of [employer], DO.  I have reviewed the above documentation for accuracy and completeness, and I agree with the above. Robin Humphrey, D.O.  The 21st Century Cures Act was signed into law in 2016 which includes the topic of electronic health records.  This provides immediate access to information in MyChart. This includes consultation notes, operative notes, office notes, lab results and pathology reports.  If you have any questions about what you read please let us  know at your next visit so we can discuss your concerns and take corrective action if need be.  We are right here with you.

## 2024-06-18 ENCOUNTER — Ambulatory Visit (INDEPENDENT_AMBULATORY_CARE_PROVIDER_SITE_OTHER): Admitting: Family Medicine

## 2024-06-24 ENCOUNTER — Encounter (INDEPENDENT_AMBULATORY_CARE_PROVIDER_SITE_OTHER): Payer: Self-pay | Admitting: Family Medicine

## 2024-06-24 ENCOUNTER — Ambulatory Visit (INDEPENDENT_AMBULATORY_CARE_PROVIDER_SITE_OTHER): Admitting: Family Medicine

## 2024-06-24 DIAGNOSIS — E559 Vitamin D deficiency, unspecified: Secondary | ICD-10-CM | POA: Diagnosis not present

## 2024-06-24 DIAGNOSIS — E66813 Obesity, class 3: Secondary | ICD-10-CM

## 2024-06-24 DIAGNOSIS — R7303 Prediabetes: Secondary | ICD-10-CM | POA: Diagnosis not present

## 2024-06-24 DIAGNOSIS — E7841 Elevated Lipoprotein(a): Secondary | ICD-10-CM

## 2024-06-24 DIAGNOSIS — Z6833 Body mass index (BMI) 33.0-33.9, adult: Secondary | ICD-10-CM

## 2024-06-24 MED ORDER — VITAMIN D (ERGOCALCIFEROL) 1.25 MG (50000 UNIT) PO CAPS: 50000.0000 [IU] | ORAL_CAPSULE | ORAL | 0 refills | Status: AC

## 2024-06-24 MED ORDER — METFORMIN HCL 500 MG PO TABS: ORAL_TABLET | ORAL | 0 refills | Status: AC

## 2024-06-24 NOTE — Progress Notes (Signed)
 Robin Humphrey, D.O.  ABFM, ABOM Specializing in Clinical Bariatric Medicine  Office located at: [address] [place], [place]  [address]   Assessment and Plan:   Orders Placed This Encounter  Procedures   Insulin , random   Hemoglobin A1c   Lipid panel   VITAMIN D  25 Hydroxy (Vit-D Deficiency, Fractures)   Basic metabolic panel with GFR    Medications Discontinued During This Encounter  Medication Reason   Vitamin D , Ergocalciferol , (DRISDOL ) 1.25 MG (50000 UNIT) CAPS capsule Reorder   metFORMIN  (GLUCOPHAGE ) 500 MG tablet Reorder     Meds ordered this encounter  Medications   metFORMIN  (GLUCOPHAGE ) 500 MG tablet    Sig: 1 po with lunch and dinner daily    Dispense:  180 tablet    Refill:  0    ** OV for RF **   Do not send RF request   Vitamin D , Ergocalciferol , (DRISDOL ) 1.25 MG (50000 UNIT) CAPS capsule    Sig: Take 1 capsule (50,000 Units total) by mouth every 7 (seven) days.    Dispense:  12 capsule    Refill:  0      FOR THE DISEASE OF OBESITY:  Morbid obesity (HCC) 34.50 Class 3 severe obesity with serious comorbidity and body mass index (BMI) of 40.0 to 44.9 in adult, current 33.39 Assessment & Plan: Since last office visit on 05/05/24 patient's muscle mass has increased by 1.2 lbs. Fat mass has decreased by 4 lbs. Total body water has decreased by 0.4 lbs.  Body fat % has decreased by 1.4 %. Counseling done on how various foods will affect these numbers and how to maximize success  Total lbs lost to date: - 7 lbs Total weight loss percentage to date: - 3.23 %   Recommended Dietary Goals Robin Humphrey is currently in the action stage of change. As such, her goal is to continue weight management plan.  She has agreed to: the Category 2 Plan with 100 snack calories B/L option     Behavioral Intervention We discussed the following today: increasing lean protein intake to established goals, avoiding skipping meals, work on meal planning and preparation, and  using GPT or another AI platform for recipe ideas- searching low calorie, low carb, high protein chicken recipes etc  Additional resources provided today: Handout on balanced plate concepts.   and Handout on Healthy [employer] , egg white quiche recipe  Evidence-based interventions for health behavior change were utilized today including the discussion of self monitoring techniques, problem-solving barriers and SMART goal setting techniques.   Regarding patient's less desirable eating habits and patterns, we employed the technique of small changes.   Goal(s) for next OV: being intentional with getting adequate protein in    Recommended Physical Activity Goals Robin Humphrey has been advised to work up to 300-450 minutes of moderate intensity aerobic activity a week and strengthening exercises 2-3 times per week for cardiovascular health, weight loss maintenance and preservation of muscle mass.   She has agreed to: Think about enjoyable ways to increase daily physical activity and overcoming barriers to exercise, Increase physical activity in their day and reduce sedentary time (increase NEAT)., and Increase volume of physical activity to a goal of 240 minutes a week   Pharmacotherapy We both agreed to: Continue with current nutritional and behavioral strategies and weight loss medications (metformin )   ASSOCIATED CONDITIONS ADDRESSED TODAY:  Prediabetes Assessment & Plan Lab Results  Component Value Date   HGBA1C 6.0 (H) 02/19/2024  HGBA1C 6.0 (H) 09/04/2023   INSULIN  18.9 09/04/2023     Metformin  1 tablet with lunch and 1 tablet with dinner. Cravings and hunger are well controlled. Pt states that her carb cravings have decreased and she is struggling to get all her food in sometimes. Discussed with pt maybe decreasing Metformin  dose. Pt does not wish to cut down on metformin  at this moment and will be more intentional with getting her lean protein intake in and following her MP.  Continue metformin  and decreasing simple carbs/ sugars; increase fiber and proteins. Will obtain labs today and review at next OV.     Vitamin D  deficiency Assessment & Plan Lab Results  Component Value Date   VD25OH 50.5 02/19/2024   VD25OH 8.5 (L) 09/04/2023   ERGO 50K units. Good tolerance and compliance. No concerns today. Continue Vit D supplementation. Will obtain labs today. Refill today.    Elevated lipoprotein(a) Assessment & Plan Lab Results  Component Value Date   CHOL 176 07/03/2023   HDL 52.30 07/03/2023   LDLCALC 113 (H) 07/03/2023   TRIG 54.0 07/03/2023   CHOLHDL 3 07/03/2023    Pt is currently not on any medications. Managed with lifestyle and diet interventions. Last labs obtains LDL was high at 113. Will obtain labs today and review at next OV. Want to see an improvement. Continue with prudent meal plan.     Follow up:   No follow-ups on file. She was informed of the importance of frequent follow up visits to maximize her success with intensive lifestyle modifications for her multiple health conditions.    Subjective:    Chief complaint: Obesity Robin Humphrey is here to discuss her progress with her obesity treatment plan. She is on the Category 2 Plan with 100 snack calories B/L option and states she is following her eating plan approximately 35 % of the time. Pt is not exercising due to injury on her foot.    Interval History:  Robin Humphrey is here for a follow up office visit. Pt has experienced a weight loss of 3 lbs since last OV on 05/05/24. Pt states that she has been doing okay but is struggling with getting her protein intake in. She endorses not being super hungry. She's been unable to be active due to an injury on her foot.    Their dietary and life style habits include:  - Tracking Calories/Macros: no   - Eating More Whole Foods: yes  - Adequate Protein Intake: no   - Adequate Water Intake: yes  - Skipping Meals: yes  - Sleeping 7-9 Hours/  Night: yes    Pharmacotherapy that aid with weight loss: She is currently taking Metformin  1 po twice daily.    Review of Systems:  Pertinent positives were addressed with patient today.  Reviewed by clinician on day of visit: allergies, medications, problem list, medical history, surgical history, family history, social history, and previous encounter notes.  Weight Summary and Biometrics   Weight Lost Since Last Visit: 3lb  Weight Gained Since Last Visit: 0lb    Vitals Temp: 98.3 F (36.8 C) BP: 118/75 Pulse Rate: 72 SpO2: 98 %   Anthropometric Measurements Height: 5' 6.5 (1.689 m) Weight: 210 lb (95.3 kg) BMI (Calculated): 33.39 Weight at Last Visit: 213lb Weight Lost Since Last Visit: 3lb Weight Gained Since Last Visit: 0lb Starting Weight: 217lb Total Weight Loss (lbs): 7 lb (3.175 kg)   Body Composition  Body Fat %: 37.7 % Fat Mass (lbs): 79.4 lbs Muscle  Mass (lbs): 124.4 lbs Total Body Water (lbs): 85.4 lbs Visceral Fat Rating : 8   Other Clinical Data Fasting: yes Labs: no Today's Visit #: 8 Starting Date: 09/04/23    Objective:   PHYSICAL EXAM: Blood pressure 118/75, pulse 72, temperature 98.3 F (36.8 C), height 5' 6.5 (1.689 m), weight 210 lb (95.3 kg), SpO2 98%. Body mass index is 33.39 kg/m.  General: she is overweight, cooperative and in no acute distress. PSYCH: Has normal mood, affect and thought process.   HEENT: EOMI, sclerae are anicteric. Lungs: Normal breathing effort, no conversational dyspnea. Extremities: Moves * 4 Neurologic: A and O * 3, good insight  DIAGNOSTIC DATA REVIEWED: BMET    Component Value Date/Time   NA 137 02/19/2024 0858   K 4.2 02/19/2024 0858   CL 101 02/19/2024 0858   CO2 19 (L) 02/19/2024 0858   GLUCOSE 97 02/19/2024 0858   GLUCOSE 107 (H) 07/03/2023 0903   BUN 10 02/19/2024 0858   CREATININE 0.77 02/19/2024 0858   CALCIUM 9.2 02/19/2024 0858   GFRNONAA 109 09/04/2019 1431   GFRAA 125  09/04/2019 1431   Lab Results  Component Value Date   HGBA1C 6.0 (H) 02/19/2024   HGBA1C 6.0 (H) 09/04/2023   Lab Results  Component Value Date   INSULIN  18.9 09/04/2023   Lab Results  Component Value Date   TSH 1.72 07/03/2023   CBC    Component Value Date/Time   WBC 5.5 07/03/2023 0903   RBC 4.74 07/03/2023 0903   HGB 12.0 07/03/2023 0903   HGB 12.5 11/16/2020 0920   HCT 37.5 07/03/2023 0903   HCT 38.3 11/16/2020 0920   PLT 357.0 07/03/2023 0903   PLT 344 11/16/2020 0920   MCV 79.0 07/03/2023 0903   MCV 82 11/16/2020 0920   MCH 26.7 11/16/2020 0920   MCH 27.9 08/29/2019 1305   MCHC 31.9 07/03/2023 0903   RDW 15.4 07/03/2023 0903   RDW 13.5 11/16/2020 0920   Iron Studies No results found for: IRON, TIBC, FERRITIN, IRONPCTSAT Lipid Panel     Component Value Date/Time   CHOL 176 07/03/2023 0903   TRIG 54.0 07/03/2023 0903   HDL 52.30 07/03/2023 0903   CHOLHDL 3 07/03/2023 0903   VLDL 10.8 07/03/2023 0903   LDLCALC 113 (H) 07/03/2023 0903   Hepatic Function Panel     Component Value Date/Time   PROT 6.8 02/19/2024 0858   ALBUMIN 4.3 02/19/2024 0858   AST 13 02/19/2024 0858   ALT 10 02/19/2024 0858   ALKPHOS 80 02/19/2024 0858   BILITOT 0.5 02/19/2024 0858      Component Value Date/Time   TSH 1.72 07/03/2023 0903   Nutritional Lab Results  Component Value Date   VD25OH 50.5 02/19/2024   VD25OH 8.5 (L) 09/04/2023    Attestations:   Robin Humphrey, acting as a Stage manager for Robin Jenkins, DO., have compiled all relevant documentation for today's office visit on behalf of Robin Jenkins, DO, while in the presence of [employer], DO.  I have reviewed the above documentation for accuracy and completeness, and I agree with the above. Robin Humphrey, D.O.  The 21st Century Cures Act was signed into law in 2016 which includes the topic of electronic health records.  This provides immediate access to information in MyChart.  This  includes consultation notes, operative notes, office notes, lab results and pathology reports.  If you have any questions about what you read please let us  know at your next  visit so we can discuss your concerns and take corrective action if need be.  We are right here with you.

## 2024-06-25 LAB — LIPID PANEL
Chol/HDL Ratio: 3.4 ratio (ref 0.0–4.4)
Cholesterol, Total: 188 mg/dL (ref 100–199)
HDL: 56 mg/dL (ref >39)
LDL Chol Calc (NIH): 119 mg/dL — ABNORMAL HIGH (ref 0–99)
Triglycerides: 68 mg/dL (ref 0–149)
VLDL Cholesterol Cal: 13 mg/dL (ref 5–40)

## 2024-06-25 LAB — BASIC METABOLIC PANEL WITH GFR
BUN/Creatinine Ratio: 14 (ref 9–23)
BUN: 10 mg/dL (ref 6–20)
CO2: 19 mmol/L — ABNORMAL LOW (ref 20–29)
Calcium: 9.6 mg/dL (ref 8.7–10.2)
Chloride: 104 mmol/L (ref 96–106)
Creatinine, Ser: 0.72 mg/dL (ref 0.57–1.00)
Glucose: 83 mg/dL (ref 70–99)
Potassium: 4.2 mmol/L (ref 3.5–5.2)
Sodium: 138 mmol/L (ref 134–144)
eGFR: 110 mL/min/1.73 (ref >59)

## 2024-06-25 LAB — INSULIN, RANDOM: INSULIN: 12.8 u[IU]/mL (ref 2.6–24.9)

## 2024-06-25 LAB — HEMOGLOBIN A1C
Est. average glucose Bld gHb Est-mCnc: 126 mg/dL
Hgb A1c MFr Bld: 6 % — ABNORMAL HIGH (ref 4.8–5.6)

## 2024-06-25 LAB — VITAMIN D 25 HYDROXY (VIT D DEFICIENCY, FRACTURES): Vit D, 25-Hydroxy: 26.9 ng/mL — ABNORMAL LOW (ref 30.0–100.0)

## 2024-07-03 ENCOUNTER — Ambulatory Visit: Payer: Commercial Managed Care - HMO | Admitting: Internal Medicine

## 2024-07-03 ENCOUNTER — Encounter: Payer: Self-pay | Admitting: Internal Medicine

## 2024-07-03 ENCOUNTER — Other Ambulatory Visit (HOSPITAL_COMMUNITY)
Admission: RE | Admit: 2024-07-03 | Discharge: 2024-07-03 | Disposition: A | Discharge status: Home / Self Care | Source: Ambulatory Visit | Attending: Internal Medicine | Admitting: Internal Medicine

## 2024-07-03 VITALS — BP 120/80 | HR 71 | Temp 98.6°F | Ht 66.5 in | Wt 213.6 lb

## 2024-07-03 DIAGNOSIS — Z23 Encounter for immunization: Secondary | ICD-10-CM | POA: Diagnosis not present

## 2024-07-03 DIAGNOSIS — M79672 Pain in left foot: Secondary | ICD-10-CM | POA: Diagnosis not present

## 2024-07-03 DIAGNOSIS — Z113 Encounter for screening for infections with a predominantly sexual mode of transmission: Secondary | ICD-10-CM | POA: Diagnosis present

## 2024-07-03 DIAGNOSIS — Z0000 Encounter for general adult medical examination without abnormal findings: Secondary | ICD-10-CM | POA: Diagnosis not present

## 2024-07-03 NOTE — Progress Notes (Signed)
 Subjective:   Robin Humphrey [birth year]  07/03/2024   CC: Chief Complaint  Patient presents with   Annual Exam    Pt is fasting , wants STD testing    HPI: Robin Humphrey is a 38 y.o. female who presents for a routine health maintenance exam.  Labs not collected at time of visit. Recent labs on 06/24/2024 with [facility].   Would like screening for STD's. Asymptomatic. Uses protection.   Patient also complains of intermittent left foot pain. Pain is located on bottom of midfoot. She report pain went on for 3 weeks consistently, then resolved on its own. No current pain.   HEALTH SCREENINGS: - Pap smear: up to date - OBGYN - Mammogram (40+): Not applicable  - Colonoscopy (45+): Not applicable  - Bone Density (65+): Not applicable  - Lung CA screening with low-dose CT:  Not applicable Adults age 61-80 who are current cigarette smokers or quit within the last 15 years. Must have 20 pack year history.   IMMUNIZATIONS: - Tdap: Tetanus vaccination status reviewed: last tetanus booster within 10 years. - HPV: Administered today - Influenza: Administered today   Past medical history, surgical history, medications, allergies, family history and social history reviewed with patient today and changes made to appropriate areas of the chart.   Social History   Socioeconomic History   Marital status: Single    Spouse name: Not on file   Number of children: Not on file   Years of education: Not on file   Highest education level: Bachelor's degree (e.g., BA, AB, BS)  Occupational History   Not on file  Tobacco Use   Smoking status: Never   Smokeless tobacco: Never  Vaping Use   Vaping status: Never Used  Substance and Sexual Activity   Alcohol use: Yes    Comment: occasionally   Drug use: Never   Sexual activity: Yes    Birth control/protection: I.U.D.  Other Topics Concern   Not on file  Social History Narrative   Not on file   Social Drivers of Health   Financial Resource  Strain: Medium Risk (07/02/2024)   Overall Financial Resource Strain (CARDIA)    Difficulty of Paying Living Expenses: Somewhat hard  Food Insecurity: No Food Insecurity (07/02/2024)   Hunger Vital Sign    Worried About Running Out of Food in the Last Year: Never true    Ran Out of Food in the Last Year: Never true  Transportation Needs: No Transportation Needs (07/02/2024)   PRAPARE - Administrator, Civil Service (Medical): No    Lack of Transportation (Non-Medical): No  Physical Activity: Inactive (07/02/2024)   Exercise Vital Sign    Days of Exercise per Week: 0 days    Minutes of Exercise per Session: Not on file  Stress: No Stress Concern Present (07/02/2024)   [employer] of Occupational Health - Occupational Stress Questionnaire    Feeling of Stress: Not at all  Social Connections: Socially Isolated (07/02/2024)   Social Connection and Isolation Panel    Frequency of Communication with Friends and Family: More than three times a week    Frequency of Social Gatherings with Friends and Family: Twice a week    Attends Religious Services: Patient declined    Database administrator or Organizations: No    Attends Engineer, structural: Not on file    Marital Status: Never married  Intimate Partner Violence: Not on file     Past Medical History:  Diagnosis Date   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 OTC    levonorgestrel  (MIRENA ) 20 MCG/DAY IUD 1 each by Intrauterine route once. Placed 04/2023   metFORMIN  (GLUCOPHAGE ) 500 MG tablet 1 po with lunch and dinner daily   Vitamin D , Ergocalciferol , (DRISDOL ) 1.25 MG (50000 UNIT) CAPS capsule Take 1 capsule (50,000 Units total) by mouth every 7 (seven) days.   metoprolol  succinate (TOPROL  XL) 25 MG 24 hr tablet Take 1 tablet (25 mg total) by mouth at bedtime. Please call [phone] to schedule an appointment with Dr. Mona for future refills. Thank you. (Patient not taking: Reported on 07/03/2024)   metroNIDAZOLE  (FLAGYL ) 500 MG tablet Take by mouth. (Patient not taking: Reported on 07/03/2024)   valACYclovir (VALTREX) 1000 MG tablet Take 1,000 mg by mouth daily as needed (cold sores). (Patient not taking: Reported on 06/24/2024)   No current facility-administered medications on file prior to visit.    No Known Allergies  Family History  Problem Relation Age of Onset   Diabetes Mother    Hypertension Mother    Hyperlipidemia Mother    Depression Mother    Anxiety disorder Mother    Bipolar disorder Mother    Obesity Mother    Asthma Sister    Asthma Brother    Breast cancer Maternal Aunt    Heart disease Maternal Aunt    Stroke Maternal Grandmother    Heart disease Maternal Grandmother    CAD Other      ROS: Denies fever, fatigue, unexplained weight loss/gain, hearing or vision changes, cardiac or respiratory complaints. Denies neurological deficits, gastrointestinal or genitourinary complaints, mental health complaints, and skin changes.   Objective:   Today's Vitals   07/03/24 0826  BP: 120/80  Pulse: 71  Temp: 98.6 F (37 C)  TempSrc: Temporal  SpO2: 98%  Weight: 213 lb 9.6 oz (96.9 kg)  Height: 5' 6.5 (1.689 m)    GENERAL APPEARANCE: Well-appearing, in NAD. Well nourished.  SKIN: Pink, warm and dry. Turgor normal. No rash, lesion, ulceration, or ecchymoses. Hair evenly distributed.  HEENT: HEAD: Normocephalic.  EYES:  PERRLA. EOMI. Lids intact w/o defect. Sclera white, Conjunctiva pink w/o exudate.  EARS: External ear w/o redness, swelling, masses or lesions. EAC clear. TM's intact, translucent w/o bulging, appropriate landmarks visualized. Appropriate acuity to conversational tones.  NOSE: Septum midline w/o deformity. Nares patent, mucosa pink and non-inflamed w/o drainage.  THROAT: Uvula midline. Oropharynx clear. Tonsils non-inflamed w/o exudate. Oral mucosa pink and moist.  NECK: Supple, Trachea midline. Full ROM w/o pain or tenderness. No lymphadenopathy. Thyroid  non-tender w/o enlargement or palpable masses.   RESPIRATORY: Chest wall symmetrical w/o masses. Respirations even and non-labored. Breath sounds clear to auscultation bilaterally. No wheezes, rales, rhonchi, or crackles. CARDIAC: S1, S2 present, regular rate and rhythm. No gallops, murmurs, rubs, or clicks.  Capillary refill <2 seconds. Peripheral pulses  2+ bilaterally. GI: Abdomen soft w/o distention. Normoactive bowel sounds. No palpable masses or tenderness. No guarding or rebound tenderness. Liver and spleen w/o tenderness or enlargement. No CVA tenderness.  MSK: Muscle tone and strength appropriate for age, w/o atrophy or abnormal movement.  EXTREMITIES: Active ROM intact, w/o tenderness, crepitus, or contracture. No obvious joint deformities or effusions. Elevated arch to left midfoot.  No clubbing, edema, or cyanosis.  NEUROLOGIC: CN's II-XII intact. Motor strength symmetrical with no obvious weakness. No sensory deficits. Steady, even gait.  PSYCH/MENTAL STATUS: Alert, oriented x 3. Cooperative, appropriate mood and affect.    Depression and Anxiety Screen done today and results listed below:     07/03/2023    8:26 AM 06/21/2023    4:26 PM 05/24/2023   12:26 PM 05/23/2023    9:00 AM 04/11/2023    2:36 PM  Depression screen PHQ 2/9  Decreased Interest 0 1 0 1 1  Down, Depressed, Hopeless 0 0 0 0 0  PHQ - 2 Score 0 1 0 1 1  Altered  sleeping  2 1 1 1   Tired, decreased energy  0 1 1 1   Change in appetite  1 0 1 2  Feeling bad or failure about yourself   0 0 0 0  Trouble concentrating  0 0 0 0  Moving slowly or fidgety/restless  0 0 0 0  Suicidal thoughts  0 0 0 0  PHQ-9 Score  4 2 4 5   Difficult doing work/chores     Not difficult at all      06/21/2023    4:26 PM 05/24/2023   12:26 PM 05/23/2023    9:00 AM 04/11/2023    2:36 PM  GAD 7 : Generalized Anxiety Score  Nervous, Anxious, on Edge 0 0 0 0  Control/stop worrying 0 0 1 0  Worry too much - different things 1 0 0 0  Trouble relaxing 0 1 1 0  Restless 0 0 0 0  Easily annoyed or irritable 1 1 1  0  Afraid - awful might happen 0 0 0 0  Total GAD 7 Score 2 2 3  0  Anxiety Difficulty    Not difficult at all     Results for orders placed or performed in visit on 06/24/24  Insulin , random   Collection Time: 06/24/24 12:44 PM  Result Value Ref Range   INSULIN  12.8 2.6 - 24.9 uIU/mL  Hemoglobin A1c   Collection Time: 06/24/24 12:44 PM  Result Value Ref Range   Hgb A1c MFr Bld 6.0 (H) 4.8 - 5.6 %   Est. average glucose Bld gHb Est-mCnc 126 mg/dL  Lipid panel   Collection Time: 06/24/24 12:44 PM  Result Value Ref Range   Cholesterol, Total 188 100 - 199 mg/dL   Triglycerides 68 0 - 149 mg/dL   HDL 56 >60 mg/dL   VLDL Cholesterol Cal 13 5 - 40 mg/dL   LDL Chol Calc (NIH) 880 (H) 0 - 99 mg/dL   Chol/HDL Ratio 3.4 0.0 - 4.4 ratio  VITAMIN D  25 Hydroxy (Vit-D Deficiency, Fractures)   Collection Time: 06/24/24 12:44 PM  Result Value Ref Range   Vit D, 25-Hydroxy 26.9 (L) 30.0 - 100.0 ng/mL  Basic metabolic panel with GFR   Collection Time: 06/24/24 12:44 PM  Result Value Ref Range   Glucose 83 70 - 99 mg/dL   BUN 10 6 - 20 mg/dL   Creatinine, Ser 9.27 0.57 - 1.00 mg/dL   eGFR 889 >40  mL/min/1.73   BUN/Creatinine Ratio 14 9 - 23   Sodium 138 134 - 144 mmol/L   Potassium 4.2 3.5 - 5.2 mmol/L   Chloride 104 96 - 106 mmol/L   CO2 19 (L) 20 - 29 mmol/L    Calcium 9.6 8.7 - 10.2 mg/dL    Assessment & Plan:  Annual physical exam - repeat 1 year  Screening for STD (sexually transmitted disease) -     Cervicovaginal ancillary only -     RPR -     HIV Antibody (routine testing w rflx) -     HepB+HepC+HIV Panel  Left foot pain - advised arch support and good supportive shoes  Immunization due -     Flu vaccine trivalent PF, 6mos and older(Flulaval,Afluria,Fluarix,Fluzone) -     HPV 9-valent vaccine,Recombinat    Orders Placed This Encounter  Procedures   Flu vaccine trivalent PF, 6mos and older(Flulaval,Afluria,Fluarix,Fluzone)   HPV 9-valent vaccine,Recombinat   RPR   HIV antibody (with reflex)   HepB+HepC+HIV Panel    PATIENT COUNSELING:  - Encourage a healthy well-balanced diet. Patient may adjust caloric intake to maintain or achieve ideal body weight. May reduce intake of dietary saturated fat and total fat and have adequate dietary potassium and calcium preferably from fresh fruits, vegetables, and low-fat dairy products.    - importance of regular exercise  NEXT PREVENTATIVE PHYSICAL DUE IN 1 YEAR.  Return in about 2 months (around 09/02/2024) for 2nd HPV vaccine (nurse visit). AND 1 year for Annual Physical.  .  Rosina Senters, FNP

## 2024-07-04 LAB — RPR: RPR Ser Ql: NONREACTIVE

## 2024-07-04 LAB — HEPB+HEPC+HIV PANEL
HIV Screen 4th Generation wRfx: NONREACTIVE
Hep B C IgM: NEGATIVE
Hep B Core Total Ab: NEGATIVE
Hep B E Ab: NONREACTIVE
Hep B E Ag: NEGATIVE
Hep B Surface Ab, Qual: NONREACTIVE
Hep C Virus Ab: NONREACTIVE
Hepatitis B Surface Ag: NEGATIVE

## 2024-07-04 LAB — CERVICOVAGINAL ANCILLARY ONLY
Bacterial Vaginitis (gardnerella): NEGATIVE
Candida Glabrata: NEGATIVE
Candida Vaginitis: NEGATIVE
Chlamydia: NEGATIVE
Comment: NEGATIVE
Comment: NEGATIVE
Comment: NEGATIVE
Comment: NEGATIVE
Comment: NEGATIVE
Comment: NORMAL
Neisseria Gonorrhea: NEGATIVE
Trichomonas: NEGATIVE

## 2024-07-04 LAB — HIV ANTIBODY (ROUTINE TESTING W REFLEX)
HIV 1&2 Ab, 4th Generation: NONREACTIVE
HIV FINAL INTERPRETATION: NEGATIVE

## 2024-07-07 ENCOUNTER — Ambulatory Visit: Payer: Self-pay | Admitting: Internal Medicine

## 2024-08-19 ENCOUNTER — Ambulatory Visit (INDEPENDENT_AMBULATORY_CARE_PROVIDER_SITE_OTHER): Payer: Self-pay | Admitting: Family Medicine

## 2024-09-02 ENCOUNTER — Ambulatory Visit: Payer: Self-pay

## 2025-07-06 ENCOUNTER — Encounter: Admitting: Internal Medicine
